# Patient Record
Sex: Male | Born: 1960 | Race: White | Hispanic: No | Marital: Married | Smoking: Never smoker
Health system: Southern US, Community
[De-identification: ages and names within clinical notes are randomized; demographics above are authoritative.]

## PROBLEM LIST (undated history)

## (undated) DIAGNOSIS — F32A Depression, unspecified: Secondary | ICD-10-CM

## (undated) DIAGNOSIS — N200 Calculus of kidney: Secondary | ICD-10-CM

## (undated) DIAGNOSIS — G0490 Encephalitis and encephalomyelitis, unspecified: Secondary | ICD-10-CM

## (undated) DIAGNOSIS — G0491 Myelitis, unspecified: Secondary | ICD-10-CM

## (undated) DIAGNOSIS — F419 Anxiety disorder, unspecified: Secondary | ICD-10-CM

## (undated) DIAGNOSIS — G35 Multiple sclerosis: Secondary | ICD-10-CM

## (undated) DIAGNOSIS — F329 Major depressive disorder, single episode, unspecified: Secondary | ICD-10-CM

## (undated) HISTORY — DX: Encephalitis and encephalomyelitis, unspecified: G04.90

## (undated) HISTORY — DX: Major depressive disorder, single episode, unspecified: F32.9

## (undated) HISTORY — DX: Depression, unspecified: F32.A

## (undated) HISTORY — DX: Calculus of kidney: N20.0

## (undated) HISTORY — DX: Multiple sclerosis: G35

## (undated) HISTORY — DX: Myelitis, unspecified: G04.91

## (undated) HISTORY — DX: Anxiety disorder, unspecified: F41.9

---

## 2012-01-29 ENCOUNTER — Other Ambulatory Visit: Payer: Self-pay | Admitting: Neurology

## 2012-01-29 DIAGNOSIS — G373 Acute transverse myelitis in demyelinating disease of central nervous system: Secondary | ICD-10-CM

## 2012-02-09 ENCOUNTER — Ambulatory Visit
Admission: RE | Admit: 2012-02-09 | Discharge: 2012-02-09 | Disposition: A | Discharge status: Home / Self Care | Payer: PRIVATE HEALTH INSURANCE | Source: Ambulatory Visit | Attending: Neurology | Admitting: Neurology

## 2012-02-09 VITALS — BP 140/81 | HR 54

## 2012-02-09 DIAGNOSIS — G373 Acute transverse myelitis in demyelinating disease of central nervous system: Secondary | ICD-10-CM

## 2012-02-09 LAB — CSF CELL COUNT WITH DIFFERENTIAL: RBC Count, CSF: 0 cu mm

## 2012-02-09 LAB — GLUCOSE, CSF

## 2012-02-09 LAB — PROTEIN, CSF

## 2012-02-09 NOTE — Progress Notes (Signed)
One tiger-topped tube blood drawn from right [facility] space without difficulty for labs with procedure; site unremarkable.  Drawn by Victory Dakin, RN

## 2012-02-12 LAB — CNS IGG SYNTHESIS RATE, CSF+BLOOD
Albumin, CSF: 20.7 mg/dL (ref 8.0–42.0)
IgG Index, CSF: 0.99 — ABNORMAL HIGH (ref <0.66)
IgG, CSF: 3.9 mg/dL (ref 0.8–7.7)
IgG, Serum: 856 mg/dL (ref 694–1618)
MS CNS IgG Synthesis Rate: 7.4 mg/24 h — ABNORMAL HIGH (ref <=3.3)

## 2012-02-16 LAB — MULTIPLE SCLEROSIS PANEL 2
Albumin Index: 4.7 (ref <9.0)
IgG Total CSF: 3.9 mg/dL (ref 0.5–6.1)
IgG Total: 819 mg/dL (ref 694–1618)
IgG-Index: 1 — ABNORMAL HIGH (ref <0.70)

## 2012-07-12 ENCOUNTER — Encounter: Payer: Self-pay | Admitting: Neurology

## 2012-07-12 ENCOUNTER — Ambulatory Visit (INDEPENDENT_AMBULATORY_CARE_PROVIDER_SITE_OTHER): Payer: No Typology Code available for payment source | Admitting: Neurology

## 2012-07-12 VITALS — BP 135/80 | Ht 71.0 in | Wt 215.0 lb

## 2012-07-12 DIAGNOSIS — F419 Anxiety disorder, unspecified: Secondary | ICD-10-CM

## 2012-07-12 DIAGNOSIS — F32A Depression, unspecified: Secondary | ICD-10-CM | POA: Insufficient documentation

## 2012-07-12 DIAGNOSIS — G35 Multiple sclerosis: Secondary | ICD-10-CM | POA: Insufficient documentation

## 2012-07-12 DIAGNOSIS — F411 Generalized anxiety disorder: Secondary | ICD-10-CM

## 2012-07-12 DIAGNOSIS — F329 Major depressive disorder, single episode, unspecified: Secondary | ICD-10-CM

## 2012-07-12 MED ORDER — GABAPENTIN 300 MG PO CAPS: 600.0000 mg | ORAL_CAPSULE | Freq: Three times a day (TID) | ORAL | Status: DC

## 2012-07-12 NOTE — Progress Notes (Signed)
HPI: Timothy Love is a 52 years old right-handed Caucasian male, referred by his primary care physician Dr. Emeline Gins, an orthopedic surgeon Dr. 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 Clent Ridges, MRI of thoracic spine has demonstrate right upper thoracic spine, and the left lower thoracic spine T2 lesions, expending one to 2 segment, MRI of the brain with and without contrast in [facility] demonstrated right frontal T2 lesion, left coronal radiata lesions,  there was one right parieto-occipital lobe with thin linear enhancing lesions.  He was not offered any treatment, overall he has much improved, now ambulates with mild unsteadiness, still has numbness at mid chest level, no incontinence, he also has occasional shooting numbness to his bilateral upper extremity, Lhermitte signs when he bends down his neck  I have reviewed the MRI film from [facility] diagnostic imaging, MRI cervical had patchy area of abnormality, at C2, 3, 4, 5, 6, T1-2.  CSF, more than 5 oligoclonal banding, WBC 9, above findings support definite diagnosis of relapsing remitting multiple sclerosis, he continued to have Lhermitt signs, ambulate without difficulty, no visual loss,   visual evoked potential was abnormal, secondary to a prolonged P100 latency on the left. 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    UPDATE April 11 th 2014,   [name] was started in Jan 15th 2014, his insurance will not cover [name], overall he is doing well, he took the medicine Monday Wednesday Friday evening, proceeding with ibuprofen, he complains of mild worsening depression, he also has bilateral lower extremity increases the tightness 3/10 regularly, can go up to 8/10, 10% of his time, achy pain, got up and then 300 mg 3 times a day does help him, he still works full-time as Personnel officer, performing 95% off work capacity, avoiding high ladder climbing, he does not want to consider research trial,  Physical Exam  Neck: supple no carotid bruits Respiratory: clear to auscultation bilaterally Cardiovascular: regular rate rhythm  Neurologic Exam  Mental Status: pleasant, awake, alert, cooperative to history, talking, and casual conversation. Cranial Nerves: CN II-XII pupils were equal round reactive to light.  Fundi were sharp bilaterally.  Extraocular movements were full.  Visual fields were full on confrontational test.  Facial sensation and strength were normal.  Hearing was intact to finger rubbing bilaterally.  Uvula tongue were midline.  Head turning and shoulder shrugging were normal and symmetric.  Tongue protrusion into the cheeks strength were normal.  Motor: mild bilateral lower extremity spasticity. no significant weakness. Sensory: decreased light touch, pinprick to mid thoracic level at lower extremity, preserved toe proprioception, and vibratory sensation. Coordination: Normal finger-to-nose, heel-to-shin.  There was no dysmetria noticed. Gait and Station: Narrow based and steady, was able to perform tiptoe, heel, and tandem walking with out significant  difficulty.  Romberg sign: Negative Reflexes: Deep tendon reflexes: Biceps: 2/2, Brachioradialis: 2/2, Triceps: 2/2, Pateller: 2/2, Achilles: 2/2.  Plantar responses are  extensor bilaterally.   Assessment and plan  52 years old right-handed Caucasian male,Definite relapsing remitting  multiple sclerosis,based on abnormal MRI of brain, cervical, and thoracic spine,positive oligoclonal banding, abnormal visual evoked potential.  1. Continue [name]. 2. Paxil for depression 3. Gabapentin 300mg  ii tid.  4. Labs 5. RTC In 4-6 months

## 2012-07-13 LAB — CBC
MCH: 30.2 pg (ref 26.6–33.0)
MCV: 87 fL (ref 79–97)
Platelets: 163 10*3/uL (ref 155–379)
RDW: 13.3 % (ref 12.3–15.4)

## 2012-07-13 LAB — COMPREHENSIVE METABOLIC PANEL
ALT: 26 IU/L (ref 0–44)
AST: 22 IU/L (ref 0–40)
CO2: 26 mmol/L (ref 19–28)
Calcium: 9.4 mg/dL (ref 8.7–10.2)
Potassium: 4.1 mmol/L (ref 3.5–5.2)
Sodium: 143 mmol/L (ref 134–144)

## 2012-07-17 NOTE — Progress Notes (Signed)
Quick Note:  Please call him for essentially normal lab, advise him to keep well hydration ______

## 2012-08-30 ENCOUNTER — Telehealth: Payer: Self-pay | Admitting: *Deleted

## 2012-08-30 NOTE — Telephone Encounter (Signed)
He is trying to get assistance in getting a cooling vest.  He spends a lot of time out doors and this would benefit him.  He needs confirmation that he has been diagnosed with MS in order to get the assistance.   Please fax it to [facility] with [employer] [facility] at   [phone].  If you have any questions call patient at  [phone]

## 2012-10-02 ENCOUNTER — Telehealth: Payer: Self-pay | Admitting: Neurology

## 2012-10-02 DIAGNOSIS — G35 Multiple sclerosis: Secondary | ICD-10-CM

## 2012-10-02 NOTE — Telephone Encounter (Signed)
Patient wife wants a referral to [facility] neurology for a second opinion. Please advise.

## 2012-10-03 NOTE — Telephone Encounter (Signed)
Please let him know, refer was put in

## 2012-12-13 ENCOUNTER — Ambulatory Visit: Payer: No Typology Code available for payment source | Admitting: Neurology

## 2013-03-28 ENCOUNTER — Telehealth: Payer: Self-pay | Admitting: Neurology

## 2013-03-31 NOTE — Telephone Encounter (Signed)
Patient calling to follow up on this, patient says he has been waiting for awhile now and still has not heard anything back.

## 2013-05-30 ENCOUNTER — Encounter: Payer: Self-pay | Admitting: Neurology

## 2013-05-30 ENCOUNTER — Ambulatory Visit (INDEPENDENT_AMBULATORY_CARE_PROVIDER_SITE_OTHER): Payer: BC Managed Care – PPO | Admitting: Neurology

## 2013-05-30 ENCOUNTER — Encounter (INDEPENDENT_AMBULATORY_CARE_PROVIDER_SITE_OTHER): Payer: Self-pay

## 2013-05-30 VITALS — BP 151/91 | HR 60 | Ht 71.0 in | Wt 220.0 lb

## 2013-05-30 DIAGNOSIS — R252 Cramp and spasm: Secondary | ICD-10-CM

## 2013-05-30 DIAGNOSIS — R259 Unspecified abnormal involuntary movements: Secondary | ICD-10-CM

## 2013-05-30 MED ORDER — MODAFINIL 100 MG PO TABS: 100.0000 mg | ORAL_TABLET | Freq: Two times a day (BID) | ORAL | Status: DC

## 2013-05-30 NOTE — Progress Notes (Signed)
HPI: Timothy Love is a 53 years old right-handed Caucasian male, referred by his primary care physician Dr. Emeline GinsSidney Harris, an orthopedic surgeon Dr. Clent RidgesWalsh for evaluation of transverse myelitis, last visit was April 2014.   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 Clent RidgesWalsh, MRI of thoracic spine has demonstrate right upper thoracic spine, and the left lower thoracic spine T2 lesions, expending one to 2 segment  [facility] with and [facility] demonstrated right frontal T2 lesion, left coronal radiata lesions,  there was one right parieto-occipital lobe with thin linear enhancing lesions.  He was not offered any treatment, overall he has much improved, now ambulates with mild unsteadiness, still has numbness at mid chest level, no incontinence, he also has occasional shooting numbness to his bilateral upper extremity, Lhermitte signs when he bends down his neck  I have reviewed the [facility] film [facility], MRI cervical had patchy area of abnormality, at C2, 3, 4, 5, 6, T1-2.  CSF, more than 5 oligoclonal banding, WBC 9, above findings support definite diagnosis of relapsing remitting multiple sclerosis, he continued to have Lhermitt signs, ambulate without difficulty, no visual loss,   visual evoked potential was abnormal, secondary to a prolonged P100 latency on the left. 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% off work capacity, avoiding high ladder climbing, he does not want to consider research trial,  UPDATE Feb 27th 2015: He was changed to [place] since Nov 2014, mild nause, flushing, after he had a repeat MRI brain and spine in Oct 2014, he had a [facility], by Elvera BickerZelasky, [name]  Over the past one year, overall, he is clinically stable, but on the repeat MRI of the brain, cervical, and thoracic spine, there was progression of disease, this was done in October 2014 at [facility]   MRI thoracic spine, Atrophic appearing thoracic cord with multiple T2 hyperintense lesions consistent with demyelinating plaques. No lesions demonstrate enhancement to indicate active demyelination.Compression deformity of the T10 vertebral body with edema and enhancement in the superior endplate, indicating that this is acute/subacute in nature.  most notably at the T7-8 level appear larger than on the examination from 08/09/11. In addition, the T10 compression deformity described on the current study is new, and was not present on the 08/09/11 study  MRI cervical spine, the brainstem lesions described on the present examination from 01/03/13  appear new compared to the 02/19/12 study. Multiple lesions throughout the cervical cord appear overall unchanged. multiple T2 hyperintense plaques throughout the cervical cord and proximal brainstem most notably the craniocervical junction and throughout the cervical cord extending from  approximately C2-C6. No enhancement is seen. There is mild multilevel uncovertebral and facet hypertrophy, but without significant canal or foraminal stenosis.   MRI of brain, Multiple T2 hyperintense lesions throughout the brainstem and cervical cord consistent with multiple sclerosis. No enhancement to suggest active demyelination.There is definitely a new lesion in the left periventricular white matter. There are also likely several new punctate lesions adjacent to the anterior horn of the cheeks and right lateral ventricle, however this may be exaggerated given differences in slice thickness between the 2 studies. The infratentorial lesions are not definitively seen on the prior study, however this may again be due to to technical factors.  He still works full times, at times, he complains of fatigue, legs gave up, hands does not cooperative, finger tips and feet paresthesia, gets tired, falling off ladders, loose balance,  No significant bowel and bladder issues, urgerncy.    Physical Exam  Neck: supple no carotid bruits Respiratory: clear to auscultation bilaterally Cardiovascular: regular rate rhythm  Neurologic Exam  Mental Status: pleasant, awake, alert, cooperative to history, talking, and casual conversation. Cranial Nerves: CN II-XII pupils were equal round reactive to light.  Fundi were sharp bilaterally.  Extraocular movements were full.  Visual fields were full on confrontational test.  Facial sensation and strength were normal.  Hearing was intact to finger rubbing bilaterally.  Uvula tongue were midline.  Head turning and shoulder shrugging were normal and symmetric.  Tongue protrusion into the cheeks strength were normal.  Motor: mild bilateral lower extremity spasticity. no significant weakness. Sensory: decreased light touch, pinprick to mid thoracic level at lower extremity, preserved toe proprioception, and vibratory sensation. Coordination: Normal finger-to-nose,  heel-to-shin.  There was no dysmetria noticed. Gait and Station: Narrow based and steady, was able to perform tiptoe, heel, and tandem walking with out significant  difficulty.  Romberg sign: Negative Reflexes: Deep tendon reflexes: Biceps: 2/2, Brachioradialis: 2/2, Triceps: 2/2, Pateller: 2/2, Achilles: 2/2.  Plantar responses are extensor bilaterally.   Assessment and plan  53 years old right-handed Caucasian male,Definite relapsing remitting multiple sclerosis,based on abnormal MRI of brain, cervical, and thoracic spine,positive oligoclonal banding, abnormal visual evoked potential.  1. Continue [name] 2. Start Provigil 100mg  bid. 3, he is willing to enroll in [employer] Spasticity trial for Baclofen ER.

## 2013-06-06 ENCOUNTER — Telehealth: Payer: Self-pay | Admitting: Neurology

## 2013-06-06 NOTE — Telephone Encounter (Signed)
Pt called.  He wanted to update the dosage amount for his Vitamin D; he is taking 2000 IU.  He also stated that when the prescription for the Provigil was sent to the pharmacy his insurance wouldn't pay for it.  His pharmacist stated they would be faxing [place] something in order for his insurance to cover it.  Please call if necessary.  Thank you.

## 2013-06-06 NOTE — Telephone Encounter (Signed)
Pt called and stated that [facility] will be sending a CD of the last MRI he had there to Dr. Zannie Cove attention.  He wanted to make her aware of this so that we could be looking for it.  Please contact if necessary.  Thank you.

## 2013-06-06 NOTE — Telephone Encounter (Signed)
Updated Vitamin D OTC dose on med list per patient request.  As well, I have faxed all requested info to insurance asking that they grant an exception and approve coverage for Provigil.  Pending response.

## 2013-06-10 ENCOUNTER — Telehealth: Payer: Self-pay | Admitting: Neurology

## 2013-06-10 NOTE — Telephone Encounter (Signed)
Shauna from [employer] calling to state that patient's insurance has denied prior authorization for Provigil and the reason is because he does not have an illness that the drug has been shown to help. If questions, Blake Divine has advised [place] to call patient's insurance company. Blake Divine is also faxing [place] this information.

## 2013-06-13 ENCOUNTER — Telehealth: Payer: Self-pay

## 2013-06-13 MED ORDER — METHYLPHENIDATE HCL 5 MG PO TABS: 5.0000 mg | ORAL_TABLET | Freq: Two times a day (BID) | ORAL | Status: DC

## 2013-06-13 NOTE — Telephone Encounter (Signed)
We received a letter from the patients insurance indicating they have denied our request for coverage of Provigil.  They state this medication is not being prescribed for an indication specified in the plans policy, which includes: Narcolepsy (sleep study required), Obstructive Sleep Apnea (sleep study required, with use of CPAP) or Shift Work Sleep Disorder (documention required).  They will not cover Nuvigil or Modafinil either, unless the patient meets this same criteria.  No formulary alternatives were listed.  Would you like to prescribe a different medication?  Please advise.

## 2013-06-13 NOTE — Telephone Encounter (Signed)
I have called him, his insurance would not cover provigil, will try Ritalin 5mg  bid

## 2013-06-20 ENCOUNTER — Other Ambulatory Visit: Payer: Self-pay | Admitting: Neurology

## 2013-06-20 MED ORDER — METHYLPHENIDATE HCL 5 MG PO TABS: 5.0000 mg | ORAL_TABLET | Freq: Two times a day (BID) | ORAL | Status: DC

## 2013-06-20 NOTE — Telephone Encounter (Signed)
I called the patient back.  Explained this med is not sampled and he would need to come to the office to get a written Rx each time he needs a refill.  Patient verbalized understanding.  Says he's not in a rush to get Rx, as he has not been fatigued lately.  Advised him we will call him when the Rx is ready for pick up.

## 2013-06-20 NOTE — Telephone Encounter (Signed)
Called looking for RX or samples for Ritalin. Nothing up front. He also wants to know if this is something he would have to come pick up every month. Please call

## 2013-06-20 NOTE — Telephone Encounter (Signed)
Patient was notified that the rx is ready for pickup.  Patient verbalized understanding.

## 2013-07-17 ENCOUNTER — Other Ambulatory Visit: Payer: BC Managed Care – PPO

## 2013-08-01 ENCOUNTER — Other Ambulatory Visit: Payer: Self-pay | Admitting: Neurology

## 2013-08-06 ENCOUNTER — Encounter: Payer: Self-pay | Admitting: Neurology

## 2013-08-08 ENCOUNTER — Encounter: Payer: Self-pay | Admitting: Neurology

## 2013-08-08 ENCOUNTER — Ambulatory Visit (INDEPENDENT_AMBULATORY_CARE_PROVIDER_SITE_OTHER): Payer: BC Managed Care – PPO | Admitting: Neurology

## 2013-08-08 VITALS — BP 128/80 | HR 57 | Ht 71.0 in | Wt 212.0 lb

## 2013-08-08 DIAGNOSIS — F32A Depression, unspecified: Secondary | ICD-10-CM

## 2013-08-08 DIAGNOSIS — F419 Anxiety disorder, unspecified: Secondary | ICD-10-CM

## 2013-08-08 DIAGNOSIS — F411 Generalized anxiety disorder: Secondary | ICD-10-CM

## 2013-08-08 DIAGNOSIS — R252 Cramp and spasm: Secondary | ICD-10-CM

## 2013-08-08 DIAGNOSIS — G35 Multiple sclerosis: Secondary | ICD-10-CM

## 2013-08-08 DIAGNOSIS — F3289 Other specified depressive episodes: Secondary | ICD-10-CM

## 2013-08-08 DIAGNOSIS — R259 Unspecified abnormal involuntary movements: Secondary | ICD-10-CM

## 2013-08-08 DIAGNOSIS — F329 Major depressive disorder, single episode, unspecified: Secondary | ICD-10-CM

## 2013-08-08 MED ORDER — BACLOFEN 10 MG PO TABS: 10.0000 mg | ORAL_TABLET | Freq: Three times a day (TID) | ORAL | Status: DC

## 2013-08-08 MED ORDER — METHYLPHENIDATE HCL 5 MG PO TABS: 5.0000 mg | ORAL_TABLET | Freq: Two times a day (BID) | ORAL | Status: DC

## 2013-08-08 NOTE — Progress Notes (Signed)
HPI: Timothy Love is a 53 years old right-handed Caucasian male, referred by his primary care physician Dr. Emeline Gins, an orthopedic surgeon Dr. Clent Ridges for evaluation of transverse myelitis, last visit was April 2014.   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 Clent Ridges, MRI of thoracic spine has demonstrate right upper thoracic spine, and the left lower thoracic spine T2 lesions, expending one to 2 segment  MRI of the brain with and without contrast in [facility] demonstrated right frontal T2 lesion, left coronal radiata lesions,  there was one right parieto-occipital lobe with thin linear enhancing lesions.  He was not offered any treatment, overall he has much improved, now ambulates with mild unsteadiness, still has numbness at mid chest level, no incontinence, he also has occasional shooting numbness to his bilateral upper extremity, Lhermitte signs when he bends down his neck  I have reviewed the MRI film from [facility] diagnostic imaging, MRI cervical had patchy area of abnormality, at C2, 3, 4, 5, 6, T1-2.  CSF, more than 5 oligoclonal banding, WBC 9, above findings support definite diagnosis of relapsing remitting multiple sclerosis, he continued to have Lhermitt signs, ambulate without difficulty, no visual loss,   visual evoked potential was abnormal, secondary to a prolonged P100 latency on the left. 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 capacity, avoiding high ladder climbing, he does not want to consider research trial,  UPDATE Feb 27th 2015: He was changed to [name] since Nov 2014, mild nause, flushing, after he had a repeat MRI brain and spine in Oct 2014, he had a second opinion at [facility], by [name], [name]  Over the past one year, overall, he is clinically stable, but on the repeat MRI of the brain, cervical, and thoracic spine, there was progression of disease, this was done in October 2014 at [facility]   MRI thoracic spine, Atrophic appearing thoracic cord with multiple T2 hyperintense lesions consistent with demyelinating plaques. No lesions demonstrate enhancement to indicate active demyelination.Compression deformity of the T10 vertebral body with edema and enhancement in the superior endplate, indicating that this is acute/subacute in nature.  most notably at the T7-8 level appear larger than on the examination from 08/09/11. In addition, the T10 compression deformity described on the current study is new, and was not present on the 08/09/11 study  MRI cervical spine, the brainstem lesions described on the present examination from 01/03/13  appear new compared to the 02/19/12 study. Multiple lesions throughout the cervical cord appear overall unchanged. multiple T2 hyperintense plaques throughout the cervical cord and proximal brainstem most notably the craniocervical junction and throughout the cervical cord extending from  approximately C2-C6. No enhancement is seen. There is mild multilevel uncovertebral and facet hypertrophy, but without significant canal or foraminal stenosis.   MRI of brain, Multiple T2 hyperintense lesions throughout the brainstem and cervical cord consistent with multiple sclerosis. No enhancement to suggest active demyelination.There is definitely a new lesion in the left periventricular white matter. There are also likely several new punctate lesions adjacent to the anterior horn of the cheeks and right lateral ventricle, however this may be exaggerated given differences in slice thickness between the 2 studies. The infratentorial lesions are not definitively seen on the prior study, however this may again be due to to technical factors.  He still works full times, at times, he complains of fatigue, legs gave up, hands does not cooperative, finger tips and feet paresthesia, gets tired, falling off ladders, loose balance,  No significant bowel and bladder issues, urgerncy.  UPDATE May 8th 2015:  [place] [place] we have reviewed MRI thoracic spine, persistent signal alteration within the cord, in a similar distribution as the prior examination. The overall size of the cord however is not as prominent suggesting possible mild improvement. No new signal alteration is evident, and there is no  abnormal region of enhancement on the postcontrast portion of the examination.  Minimal degenerative disc disease without significant spinal or  foraminal stenosis at any level.   MRI cervical spine: With and without contrast, persistent signal alteration within the cervical cord, in a distribution similar to previous examination, the overall size of the cortex is not as prominent, suggesting possible mild improvement, no new abnormal signals, no contrast enhancement.  MRI of the brain without contrast report, that he was stable white matter disease, in comparison to August 2013, the distribution of  consistent with multiple sclerosis, no contrast enhancement, 5 mm enhancing focus along the undersurface of right frontal lobe, suggesting a tiny meningioma, next lef   clinically patient is stable as well, his fatigue has much improved with ranitidine 5 mg twice a day, no significant gait difficulty, but occasionally bilateral lower extremity muscle spasm, jumpy movement, no visual change,   Physical Exam  Neck: supple no carotid bruits Respiratory: clear to auscultation bilaterally Cardiovascular: regular rate rhythm  Neurologic Exam  Mental Status: pleasant, awake, alert, cooperative to history, talking, and casual conversation. Cranial Nerves: CN II-XII pupils were equal round reactive to light.  Fundi were sharp bilaterally.  Extraocular movements were full.  Visual fields were full on confrontational test.  Facial sensation and strength were normal.  Hearing was intact to finger rubbing bilaterally.  Uvula tongue were midline.  Head turning and shoulder shrugging were normal and symmetric.  Tongue protrusion into the cheeks strength were normal.  Motor: mild bilateral lower extremity spasticity. no significant weakness. Sensory: decreased light touch, pinprick to mid thoracic level at lower extremity, preserved toe proprioception, and vibratory sensation. Coordination: Normal finger-to-nose, heel-to-shin.  There was no dysmetria noticed. Gait and Station: Narrow based and steady, was able to perform tiptoe, heel, and tandem walking with out significant  difficulty.  Romberg sign: Negative Reflexes: Deep tendon reflexes: Biceps: 2/2, Brachioradialis: 2/2, Triceps: 2/2, Pateller: 2/2, Achilles: 2/2.  Plantar responses are extensor bilaterally.   Assessment and plan  53 years old right-handed Caucasian  male, definite relapsing remitting multiple sclerosis,based on abnormal MRI of brain, cervical, and thoracic spine,positive oligoclonal banding, abnormal visual evoked potential.  1. Continue  [name] 2. refill Ritalin 5 mg twice a day, 90 day supply, 3. Start Baclofen IR 10mg  tid since may 8th 2015. 4, he is willing to enroll in [employer] Spasticity trial for Baclofen ER.

## 2013-08-12 ENCOUNTER — Telehealth: Payer: Self-pay | Admitting: Neurology

## 2013-08-12 NOTE — Telephone Encounter (Signed)
I have reviewed MRI report from [facility], dated October 2014, MRI of the brain showed multiple white matter lesions, scattered flair hyperintense lesions in the periventricular white matter, left pons, right superior cerebellum, right brachial pontes, subcortical lesions at bilateral anterior frontal lobes, no abnormal enhancement, optic nerves are atrophic,  MRI cervical spine showed multiple T. 2 hyperintense lesions throughout the brain stem, and cervical cord, extending from C2-C6, no enhancing lesions,  MRI of thoracic spine, atrophic appearing thoracic cord with multiple T2 hyperintense lesions, consistent with demyelinating plaques, no contrast enhancement, compression deformity of T10 vertebral bodies with edema, enhancement in the superior endplate,

## 2013-09-05 ENCOUNTER — Other Ambulatory Visit: Payer: Self-pay | Admitting: Neurology

## 2013-09-05 NOTE — Telephone Encounter (Signed)
Patient requesting a written Rx for Rittalin--please call patient when ready for pick up.

## 2013-09-05 NOTE — Telephone Encounter (Signed)
Request forwarded to provider for approval  

## 2013-09-08 MED ORDER — METHYLPHENIDATE HCL 5 MG PO TABS: 5.0000 mg | ORAL_TABLET | Freq: Two times a day (BID) | ORAL | Status: DC

## 2013-09-09 NOTE — Telephone Encounter (Signed)
Called pt and left message informing him that his Rx was ready to be picked up at the front desk and if he has any other problems, questions or concerns to call the office. °

## 2013-10-06 ENCOUNTER — Other Ambulatory Visit: Payer: Self-pay | Admitting: Neurology

## 2013-10-06 MED ORDER — METHYLPHENIDATE HCL 5 MG PO TABS: 5.0000 mg | ORAL_TABLET | Freq: Two times a day (BID) | ORAL | Status: DC

## 2013-10-06 NOTE — Telephone Encounter (Signed)
This Rx was written last month for a 90 day supply.  I called the pharmacy, spoke with Denyse Amassorey.  He said the patient's ins will only allow 30 days per fill, so they filled #60 and then the remaining tabs were void because it's a CII.  Rx has been entered for 30 day supply and forwarded to provider for approval.

## 2013-10-06 NOTE — Telephone Encounter (Signed)
Patient requesting Rx refill for methylphenidate (RITALIN) 5 MG tablet and requesting call when ready for pick up.

## 2013-10-06 NOTE — Telephone Encounter (Signed)
Called pt and left message informing him that his Rx was ready to be picked up at the front desk and if he has any other problems, questions or concerns to call the office. °

## 2013-11-04 ENCOUNTER — Telehealth: Payer: Self-pay | Admitting: Neurology

## 2013-11-04 NOTE — Telephone Encounter (Signed)
Patient requesting Rx refill for methylphenidate (RITALIN) 5 MG tablet,  Please call anytime when ready for pick up.  If not available please leave message on voice mail.  Thanks

## 2013-11-05 ENCOUNTER — Other Ambulatory Visit: Payer: Self-pay

## 2013-11-05 MED ORDER — METHYLPHENIDATE HCL 5 MG PO TABS: 5.0000 mg | ORAL_TABLET | Freq: Two times a day (BID) | ORAL | Status: DC

## 2013-11-06 NOTE — Telephone Encounter (Signed)
Called pt and left message informing him that his Rx was ready to be picked up at the front desk and if he has any other problems, questions or concerns to call the office. °

## 2013-12-10 ENCOUNTER — Other Ambulatory Visit: Payer: Self-pay | Admitting: Neurology

## 2013-12-10 MED ORDER — METHYLPHENIDATE HCL 5 MG PO TABS: 5.0000 mg | ORAL_TABLET | Freq: Two times a day (BID) | ORAL | Status: DC

## 2013-12-10 NOTE — Telephone Encounter (Signed)
Rx entered, request forwarded to provider for approval  

## 2013-12-10 NOTE — Telephone Encounter (Signed)
Patient requesting refill of Ritalin (generic). Best call back # is [phone]

## 2013-12-11 ENCOUNTER — Telehealth: Payer: Self-pay | Admitting: Neurology

## 2013-12-11 NOTE — Telephone Encounter (Signed)
Timothy Love just spoke with patient advising Rx is ready for pick up.  Please see previous encounter

## 2013-12-11 NOTE — Telephone Encounter (Signed)
Called pt to inform him that his Rx was ready to be picked up at the front desk and if he has any other problems, questions or concerns to call the office. Pt verbalized understanding. °

## 2013-12-11 NOTE — Telephone Encounter (Signed)
Patient calling to state that the only day he can come pick up his prescription is Friday and is hoping it will be ready. He was assured he will be called when it is ready for pick up.

## 2013-12-31 ENCOUNTER — Telehealth: Payer: Self-pay | Admitting: Neurology

## 2013-12-31 MED ORDER — DIMETHYL FUMARATE 240 MG PO CPDR: 240.0000 mg | DELAYED_RELEASE_CAPSULE | Freq: Two times a day (BID) | ORAL | Status: DC

## 2013-12-31 NOTE — Telephone Encounter (Signed)
Rx has been sent.  I called the patient back.  He is aware.  

## 2013-12-31 NOTE — Telephone Encounter (Signed)
Patient calling to state that his [facility] pharmacy called him to state that he needs to have his [name] script extended, please return call and advise.

## 2014-01-05 ENCOUNTER — Telehealth: Payer: Self-pay | Admitting: Neurology

## 2014-01-05 NOTE — Telephone Encounter (Signed)
30 day supply last written 09/09

## 2014-01-05 NOTE — Telephone Encounter (Signed)
Patient requesting Rx refill for methylphenidate (RITALIN) 5 MG tablet.  Please call when ready for pick up. ° °

## 2014-01-06 ENCOUNTER — Other Ambulatory Visit: Payer: Self-pay

## 2014-01-09 MED ORDER — METHYLPHENIDATE HCL 5 MG PO TABS: 5.0000 mg | ORAL_TABLET | Freq: Two times a day (BID) | ORAL | Status: DC

## 2014-01-09 NOTE — Telephone Encounter (Signed)
Request was forwarded to provider for approval on 10/06.  It is currently pending.  I called the patient back, got no answer.  Left message.

## 2014-01-09 NOTE — Telephone Encounter (Signed)
Patient calling again to check on the status of his refill, please return call and advise, ok to leave a message since he says he's about to go and have surgery.

## 2014-01-12 ENCOUNTER — Telehealth: Payer: Self-pay | Admitting: *Deleted

## 2014-01-12 NOTE — Telephone Encounter (Signed)
Called pt to inform him that his Rx was ready to be picked up at the front desk and if he has any other problems, questions or concerns to call the office. Pt verbalized understanding. °

## 2014-01-12 NOTE — Telephone Encounter (Signed)
Patient calling to check the status of his Rx Adderall, please advise

## 2014-02-03 ENCOUNTER — Other Ambulatory Visit: Payer: Self-pay | Admitting: Neurology

## 2014-02-03 MED ORDER — METHYLPHENIDATE HCL 5 MG PO TABS: 5.0000 mg | ORAL_TABLET | Freq: Two times a day (BID) | ORAL | Status: DC

## 2014-02-03 NOTE — Telephone Encounter (Signed)
Request entered, forwarded to provider for approval.  

## 2014-02-03 NOTE — Telephone Encounter (Signed)
Patient requesting refill of Ritalin script, please call when ready for pick up.  °

## 2014-02-04 NOTE — Telephone Encounter (Signed)
I called the patient to let them know their Rx for Ritalin was ready for pickup. Patient was instructed to bring Photo ID. 

## 2014-02-06 ENCOUNTER — Telehealth: Payer: Self-pay | Admitting: Neurology

## 2014-02-06 NOTE — Telephone Encounter (Signed)
I called the patient back.  Got no answer.  Left message asking if he had spoken with PCP regarding Rx, and if so, have they have agreed to prescribe this med?

## 2014-02-06 NOTE — Telephone Encounter (Signed)
Patient would like to have his PCP in [place] (Dr. Emeline GinsSidney Harris) write his Rx for Ritalin (generic) so he would not have to drive down here once a month to get it. If this is possible, please let him know. Best number to call is [phone].

## 2014-02-06 NOTE — Telephone Encounter (Signed)
Patient returned call and stated he would check with PCP to see if they would prescribe Rx.

## 2014-02-13 ENCOUNTER — Telehealth: Payer: Self-pay | Admitting: *Deleted

## 2014-02-13 NOTE — Telephone Encounter (Signed)
I called last week about getting my RX tranferred to [place]. Its the Ritalin. My PCP is

## 2014-03-06 ENCOUNTER — Other Ambulatory Visit: Payer: Self-pay | Admitting: Neurology

## 2014-03-06 ENCOUNTER — Ambulatory Visit (INDEPENDENT_AMBULATORY_CARE_PROVIDER_SITE_OTHER): Payer: BC Managed Care – PPO | Admitting: Neurology

## 2014-03-06 ENCOUNTER — Encounter: Payer: Self-pay | Admitting: Neurology

## 2014-03-06 VITALS — BP 139/81 | HR 52 | Ht 71.0 in | Wt 212.0 lb

## 2014-03-06 DIAGNOSIS — F329 Major depressive disorder, single episode, unspecified: Secondary | ICD-10-CM

## 2014-03-06 DIAGNOSIS — R258 Other abnormal involuntary movements: Secondary | ICD-10-CM

## 2014-03-06 DIAGNOSIS — F419 Anxiety disorder, unspecified: Secondary | ICD-10-CM

## 2014-03-06 DIAGNOSIS — R252 Cramp and spasm: Secondary | ICD-10-CM

## 2014-03-06 DIAGNOSIS — G35 Multiple sclerosis: Secondary | ICD-10-CM

## 2014-03-06 DIAGNOSIS — F32A Depression, unspecified: Secondary | ICD-10-CM

## 2014-03-06 MED ORDER — GABAPENTIN 300 MG PO CAPS: 600.0000 mg | ORAL_CAPSULE | Freq: Three times a day (TID) | ORAL | Status: DC

## 2014-03-06 MED ORDER — BACLOFEN 10 MG PO TABS: 10.0000 mg | ORAL_TABLET | Freq: Three times a day (TID) | ORAL | Status: DC

## 2014-03-06 MED ORDER — METHYLPHENIDATE HCL 5 MG PO TABS: 5.0000 mg | ORAL_TABLET | Freq: Two times a day (BID) | ORAL | Status: DC

## 2014-03-06 NOTE — Telephone Encounter (Signed)
Renee/Dana:  Please check on his MRI orders, it was ordered before, now it is due, do I need to reenter order again, or previous order is still good.

## 2014-03-06 NOTE — Progress Notes (Addendum)
HPI: Timothy Love is a 53 years old right-handed Caucasian male, referred by his primary care physician Dr. Emeline Gins, an orthopedic surgeon Dr. Clent Ridges for evaluation of transverse myelitis, last visit was April 2014.   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 Clent Ridges, MRI of thoracic spine has demonstrate right upper thoracic spine, and the left lower thoracic spine T2 lesions, expending one to 2 segment  MRI of the brain with and without contrast in [facility] demonstrated right frontal T2 lesion, left coronal radiata lesions,  there was one right parieto-occipital lobe with thin linear enhancing lesions.  He was not offered any treatment, overall he has much improved, now ambulates with mild unsteadiness, still has numbness at mid chest level, no incontinence, he also has occasional shooting numbness to his bilateral upper extremity, Lhermitte signs when he bends down his neck  I have reviewed the MRI film from [facility], MRI cervical had patchy area of abnormality, at C2, 3, 4, 5, 6, T1-2.  CSF, more than 5 oligoclonal banding, WBC 9, above findings support definite diagnosis of relapsing remitting multiple sclerosis, he continued to have Lhermitt signs, ambulate without difficulty, no visual loss,   visual evoked potential was abnormal, secondary to a prolonged P100 latency on the left. 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 capacity, avoiding high ladder climbing, he does not want to consider research trial,  UPDATE Feb 27th 2015: He was changed to [name] since Nov 2014, mild nause, flushing, after he had a repeat MRI brain and spine in Oct 2014, he had a second opinion at [facility], by [name], [name]  Over the past one year, overall, he is clinically stable, but on the repeat MRI of the brain, cervical, and thoracic spine, there was progression of disease, this was done in October 2014 at [facility]   MRI thoracic spine, Atrophic appearing thoracic cord with multiple T2 hyperintense lesions consistent with demyelinating plaques. No lesions demonstrate enhancement to indicate active demyelination.Compression deformity of the T10 vertebral body with edema and enhancement in the superior endplate, indicating that this is acute/subacute in nature.  most notably at the T7-8 level appear larger than on the examination from 08/09/11. In addition, the T10 compression deformity described on the current study is new, and was not present on the 08/09/11 study  MRI cervical spine, the brainstem lesions described on the present examination from 01/03/13  appear new compared to the 02/19/12 study. Multiple lesions throughout the cervical cord appear overall unchanged. multiple T2 hyperintense plaques throughout the cervical cord and proximal brainstem most notably the craniocervical junction and throughout the cervical cord extending from  approximately C2-C6. No enhancement is seen. There is mild multilevel uncovertebral and facet hypertrophy, but without significant canal or foraminal stenosis.   MRI of brain, Multiple T2 hyperintense lesions throughout the brainstem and cervical cord consistent with multiple sclerosis. No enhancement to suggest active demyelination.There is definitely a new lesion in the left periventricular white matter. There are also likely several new punctate lesions adjacent to the anterior horn of the cheeks and right lateral ventricle, however this may be exaggerated given differences in slice thickness between the 2 studies. The infratentorial lesions are not definitively seen on the prior study, however this may again be due to to technical factors.  He still works full times, at times, he complains of fatigue, legs gave up, hands does not cooperative, finger tips and feet paresthesia, gets tired, falling off ladders, loose balance,  No significant bowel and bladder issues, urgerncy.  UPDATE May 8th 2015:  He had MRI at [place], [place], we have reviewed MRI thoracic spine, persistent signal alteration within the cord, in a similar distribution as the prior examination. The overall size of the cord however is not as prominent suggesting possible mild improvement. No new signal alteration is evident, and there is no  abnormal region of enhancement on the postcontrast portion of the examination.  Minimal degenerative disc disease without significant spinal or  foraminal stenosis at any level.   MRI cervical spine: With and without contrast, persistent signal alteration within the cervical cord, in a distribution similar to previous examination, the overall size of the cortex is not as prominent, suggesting possible mild improvement, no new abnormal signals, no contrast enhancement.  MRI of the brain without contrast report, that he was stable white matter disease, in comparison to August 2013, the distribution of  consistent with multiple sclerosis, no contrast enhancement, 5 mm enhancing focus along the undersurface of right frontal lobe, suggesting a tiny meningioma, next lef   clinically patient is stable as well, his fatigue has much improved with ranitidine 5 mg twice a day, no significant gait difficulty, but occasionally bilateral lower extremity muscle spasm, jumpy movement, no visual change  UPDATE Dec 4th 2015: He is dong well, a lot better than 2014, he does not have those frequent episode of left jerking, ritalin 5 mg twice a day he has been very helpful, he was able to work full time  He has mild flushing with [name] treatment.  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 Cranial Nerves: CN II-XII pupils were equal round reactive to light.  Fundi were sharp bilaterally.  Extraocular movements were full.  Visual fields were full on confrontational test.  Facial sensation and strength were normal.  Hearing was intact to finger rubbing bilaterally.  Uvula tongue were midline.  Head turning and shoulder shrugging were normal and symmetric.  Tongue protrusion into the cheeks strength were normal.  Motor: mild bilateral lower extremity spasticity. no significant weakness. Sensory: decreased light touch, pinprick to mid thoracic level at lower extremity, preserved toe proprioception, and vibratory sensation. Coordination: Normal finger-to-nose, heel-to-shin.  There  was no dysmetria noticed. Gait and Station: Narrow based and steady, was able to perform tiptoe, heel, and tandem walking with out significant  difficulty.  Romberg sign: Negative Reflexes: Deep tendon reflexes: Biceps: 2/2, Brachioradialis: 2/2, Triceps: 2/2, Pateller: 2/2,  Achilles: 2/2.  Plantar responses are extensor bilaterally.  Assessment and plan  53 years old right-handed Caucasian male, definite relapsing remitting multiple sclerosis,based on abnormal MRI of brain, cervical, and thoracic spine,positive oligoclonal banding, abnormal visual evoked potential.  1. Continue [name] 2. refill Ritalin 5 mg twice a day, 90 day supply, 3. Refill Baclofen IR 10mg  tid  4,  Repeat MRI brain, cervical, thoracic w/wo. 5. RTC in 6 months.

## 2014-03-07 LAB — COMPREHENSIVE METABOLIC PANEL
ALT: 18 IU/L (ref 0–44)
AST: 17 IU/L (ref 0–40)
Albumin/Globulin Ratio: 2.1 (ref 1.1–2.5)
Albumin: 4.9 g/dL (ref 3.5–5.5)
Alkaline Phosphatase: 88 IU/L (ref 39–117)
BUN/Creatinine Ratio: 20 (ref 9–20)
BUN: 17 mg/dL (ref 6–24)
CALCIUM: 10.8 mg/dL — AB (ref 8.7–10.2)
CO2: 23 mmol/L (ref 18–29)
CREATININE: 0.83 mg/dL (ref 0.76–1.27)
Chloride: 103 mmol/L (ref 97–108)
GFR calc Af Amer: 116 mL/min/{1.73_m2} (ref >59)
GFR calc non Af Amer: 100 mL/min/{1.73_m2} (ref >59)
GLOBULIN, TOTAL: 2.3 g/dL (ref 1.5–4.5)
GLUCOSE: 89 mg/dL (ref 65–99)
Potassium: 5 mmol/L (ref 3.5–5.2)
SODIUM: 145 mmol/L — AB (ref 134–144)
Total Bilirubin: 0.5 mg/dL (ref 0.0–1.2)
Total Protein: 7.2 g/dL (ref 6.0–8.5)

## 2014-03-07 LAB — CBC WITH DIFFERENTIAL
Basophils Absolute: 0 10*3/uL (ref 0.0–0.2)
Basos: 1 %
Eos: 2 %
Eosinophils Absolute: 0.1 10*3/uL (ref 0.0–0.4)
HCT: 43.4 % (ref 37.5–51.0)
Hemoglobin: 14.7 g/dL (ref 12.6–17.7)
Immature Grans (Abs): 0 10*3/uL (ref 0.0–0.1)
Immature Granulocytes: 0 %
Lymphocytes Absolute: 0.5 10*3/uL — ABNORMAL LOW (ref 0.7–3.1)
Lymphs: 10 %
MCH: 29.6 pg (ref 26.6–33.0)
MCHC: 33.9 g/dL (ref 31.5–35.7)
MCV: 87 fL (ref 79–97)
Monocytes Absolute: 0.6 10*3/uL (ref 0.1–0.9)
Monocytes: 12 %
NEUTROS PCT: 75 %
Neutrophils Absolute: 3.6 10*3/uL (ref 1.4–7.0)
Platelets: 266 10*3/uL (ref 150–379)
RBC: 4.97 x10E6/uL (ref 4.14–5.80)
RDW: 13.3 % (ref 12.3–15.4)
WBC: 4.8 10*3/uL (ref 3.4–10.8)

## 2014-03-10 NOTE — Progress Notes (Signed)
Quick Note:  Called and spoke to patient relayed normal labs. ______ 

## 2014-03-12 ENCOUNTER — Telehealth: Payer: Self-pay | Admitting: Neurology

## 2014-03-12 MED ORDER — GABAPENTIN 300 MG PO CAPS: 600.0000 mg | ORAL_CAPSULE | Freq: Three times a day (TID) | ORAL | Status: DC

## 2014-03-12 NOTE — Telephone Encounter (Signed)
Patient hadn't received mail order supply of Rx gabapentin (NEURONTIN) 300 MG capsule.  Stated he has only enough for today, questioning if we could forward 10 day to [employer] in [place].  Please call and advise.

## 2014-03-12 NOTE — Telephone Encounter (Signed)
Rx has been sent.  I called back.  Patient is aware.  

## 2014-05-26 ENCOUNTER — Telehealth: Payer: Self-pay | Admitting: Neurology

## 2014-05-26 NOTE — Telephone Encounter (Signed)
I spoke to the patient in re the [facility] [id number] study, and the patient asked for the Informed Consent Form to review the study's information.

## 2014-06-12 ENCOUNTER — Telehealth: Payer: Self-pay | Admitting: Neurology

## 2014-06-12 NOTE — Telephone Encounter (Signed)
I have attempted without success to contact this patient by phone.I left a message for the patient to return my call.

## 2014-06-16 ENCOUNTER — Telehealth: Payer: Self-pay | Admitting: Neurology

## 2014-06-16 NOTE — Telephone Encounter (Signed)
I left a message for the patient to return my call.

## 2014-06-22 ENCOUNTER — Telehealth: Payer: Self-pay | Admitting: Neurology

## 2014-06-22 ENCOUNTER — Telehealth: Payer: Self-pay | Admitting: *Deleted

## 2014-06-22 NOTE — Telephone Encounter (Signed)
Left messages on both home and cell numbers letting him know that Dr. Terrace Arabia will need him to bring his MRI disc to his follow up appointment.

## 2014-06-22 NOTE — Telephone Encounter (Signed)
I left a message for the patient to return my call.

## 2014-06-22 NOTE — Telephone Encounter (Signed)
Results received from [facility] (scan completed on 06/19/14): Findings: There is no restricted diffusion to suggest an acute cortical or white matter infarct.  The scattered foci of high signal within the subcortical and periventricular white matter are stable.  There is no new focus of abnormal signal.  There is no significant atrophy, or ventriculomegaly .  The large vessel flow voids are grossly normal.  There is minimal bilateral ethmoid sinusitis.  The sinuses and mastoids are otherwise clear. Impression: Stable examination as described.

## 2014-06-25 ENCOUNTER — Other Ambulatory Visit: Payer: Self-pay | Admitting: Neurology

## 2014-06-26 ENCOUNTER — Telehealth: Payer: Self-pay | Admitting: Neurology

## 2014-06-26 NOTE — Telephone Encounter (Signed)
I have reviewed MRI report from [facility], MRI of cervical with without contrast June 26 2014, stable patchy abnormal high signal at the cervical cord, extending from C2-3, to approximately C6 and 4, no contrast enhancement  MRI of thoracic spine with and without contrast, probable patchy area of high signal at thoracic spine at T10, no contrast enhancement, similar appearance in the past.  Timothy Love:  He should also have MRI brain, please get MRI of the brain report,  Call patient, MRI of cervical, thoracic spine showed no significant change,  Also reminded patient to bring MRI CD at his next follow-up visit

## 2014-06-29 NOTE — Telephone Encounter (Signed)
Informed patient - left message on both home and cell to please bring in CD for MRI brain for his next follow up appt.

## 2014-09-10 ENCOUNTER — Ambulatory Visit (INDEPENDENT_AMBULATORY_CARE_PROVIDER_SITE_OTHER): Payer: BLUE CROSS/BLUE SHIELD | Admitting: Neurology

## 2014-09-10 ENCOUNTER — Encounter: Payer: Self-pay | Admitting: Neurology

## 2014-09-10 VITALS — BP 149/91 | HR 55 | Ht 71.0 in | Wt 211.0 lb

## 2014-09-10 DIAGNOSIS — G35D Multiple sclerosis, unspecified: Secondary | ICD-10-CM

## 2014-09-10 DIAGNOSIS — R258 Other abnormal involuntary movements: Secondary | ICD-10-CM | POA: Diagnosis not present

## 2014-09-10 DIAGNOSIS — R252 Cramp and spasm: Secondary | ICD-10-CM

## 2014-09-10 DIAGNOSIS — G35 Multiple sclerosis: Secondary | ICD-10-CM | POA: Diagnosis not present

## 2014-09-10 NOTE — Progress Notes (Signed)
PATIENT: Timothy Love DOB: [birth year]  Chief Complaint  Patient presents with  . Multiple Sclerosis    He has brought his MRI disc in today for review. Feels he is doing well overall.  He does have some intermittent numbness in bilateral hands that sometimes causes him difficulty with writing.    HISTORICAL  Timothy Love is a 54 years old right-handed Caucasian male, referred by his primary care physician Dr. Madelyn Flavors, and orthopedic surgeon Dr. 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 Clent Ridges, MRI of thoracic spine has demonstrate right upper thoracic spine, and the left lower thoracic spine T2 lesions, expending 1 to 2 segments  MRI of the brain with and without contrast in [facility] demonstrated right frontal T2 lesion, left coronal radiata lesions,  there was one right parieto-occipital lobe with thin linear enhancing lesions.  He was not offered any treatment, overall he has much improved, now ambulates with mild unsteadiness, still has numbness at mid chest level, no incontinence, he also has occasional shooting numbness to his bilateral upper extremity, Lhermitte signs when he bends down his neck  I have reviewed the MRI film from [facility] diagnostic imaging in 2013, MRI cervical had patchy area of abnormality, at C2, 3, 4, 5, 6, T1-2.  CSF Nov 2013, more than 5 oligoclonal banding, WBC 9, above findings support definite diagnosis of relapsing remitting multiple sclerosis, he continued to have Lhermitt  signs, ambulate without difficulty, no visual loss,   Visual evoked potential was abnormal, secondary to a prolonged P100 latency on the left. 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    UPDATE April 11 th 2014,  [name] was started in Jan 15th 2014, his insurance will not cover [name], overall he is doing well, he took the medicine Monday Wednesday Friday evening, proceeding with ibuprofen, he complains of mild worsening depression, he also has bilateral lower extremity increased  tghtness. He still works full-time as Personnel officer, performing 95% off work capacity, avoiding high ladder climbing   UPDATE Feb 27th 2015: He was changed to [name] since Nov 2014, mild nause, flushing, after he had a repeat MRI brain and spine in Oct 2014, he had a second opinion at [facility], by [name] He is clinically stable, but on the repeat MRI of the brain, cervical, and thoracic spine, there was progression of disease, this was done in October 2014 at [facility]   MRI thoracic spine, Atrophic appearing thoracic cord with multiple T2 hyperintense lesions consistent with demyelinating plaques. No lesions demonstrate enhancement to indicate active demyelination.Compression deformity of the T10 vertebral body with edema and enhancement in the superior endplate, indicating that this is acute/subacute in nature.  most notably at the T7-8 level appear larger than on the examination from 08/09/11. In addition, the T10 compression deformity described on the current study is new, and was not present on  the 08/09/11 study  MRI cervical spine, the brainstem lesions described on the present examination from 01/03/13 appear new compared to the 02/19/12 study. Multiple lesions throughout the cervical cord appear overall unchanged. multiple T2 hyperintense plaques throughout the cervical  cord and proximal brainstem most notably the craniocervical junction and throughout the cervical cord extending from approximately C2-C6. No enhancement is seen. There is mild multilevel uncovertebral and facet hypertrophy, but without significant canal or foraminal stenosis.   MRI of brain, Multiple T2 hyperintense lesions throughout the brainstem and cervical cord consistent with multiple sclerosis. No enhancement to suggest active demyelination.There is definitely a new lesion in the left periventricular white matter. There are also likely several new punctate lesions adjacent to the anterior horn of the cheeks and right lateral ventricle, however this may be exaggerated given differences in slice thickness between the 2 studies. The infratentorial lesions are not definitively seen on the prior study, however this may again be due to to technical factors.  He still works full times, at times, he complains of fatigue, legs gave up, hands does not cooperative, finger tips and feet paresthesia, gets tired, falling off ladders, loose balance,  No significant bowel and bladder issues, urgerncy.  UPDATE May 8th 2015:  He had MRI at [place], [place], we have reviewed MRI thoracic spine, persistent signal alteration within the cord, in a similar distribution as the prior examination. The overall size of the cord however is not as prominent suggesting possible mild improvement. No new signal alteration is evident, and there is no  abnormal region of enhancement on the postcontrast portion of the examination.  Minimal degenerative disc disease without significant spinal or  foraminal stenosis at any level.   MRI cervical spine: With and without contrast, persistent signal alteration within the cervical cord, in a distribution similar to previous examination, the overall size of the cortex is not as prominent, suggesting possible mild improvement, no new abnormal signals, no contrast enhancement.  MRI of the brain  without contrast report, that he was stable white matter disease, in comparison to August 2013, the distribution of consistent with multiple sclerosis, no contrast enhancement, 5 mm enhancing focus along the undersurface of right frontal lobe, suggesting a tiny meningioma, next lef   clinically patient is stable as well, his fatigue has much improved with ranitidine 5 mg twice a day, no significant gait difficulty, but occasionally bilateral lower extremity muscle spasm, jumpy movement, no visual change  UPDATE Dec 4th 2015: He is dong well, a lot better than 2014, he does not have those frequent episode of left jerking, ritalin 5 mg twice a day he has been very helpful, he was able to work full time  He has mild flushing with [name] treatment.  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  MRI of brain: there is no restricted diffusion to suggest an acute cortical or white matter infarct. The scattered foci of high signal within the subcortical and periventricular white matter are stable. There is no new focus of abnormal signal. There  is no significant atrophy, or ventriculomegaly.    REVIEW OF SYSTEMS: Full 14 system review of systems performed and notable only for fatigue, hearing loss, ringing ears, eye pain, murmur, flash, restless leg, snoring, joint pain, back pain, walking difficulty, itching, memory loss, dizziness, numbness, agitation, confusion, depression, anxiety  ALLERGIES: Allergies  Allergen Reactions  .  Penicillins Other (See Comments)    Child-doesn't know    HOME MEDICATIONS: Current Outpatient Prescriptions  Medication Sig Dispense Refill  . baclofen (LIORESAL) 10 MG tablet Take 1 tablet (10 mg total) by mouth 3 (three) times daily. 270 each 3  . Cholecalciferol (VITAMIN D PO) Take 2,000 mg by mouth daily.     Marland Kitchen FLUoxetine (PROZAC) 40 MG capsule 40 mg.    . gabapentin (NEURONTIN) 300 MG capsule Take 2 capsules (600 mg total) by mouth 3 (three) times daily. 60 capsule 1  . methylphenidate (RITALIN) 5 MG tablet Take 1 tablet (5 mg total) by mouth 2 (two) times daily. 60 tablet 0  . pravastatin (PRAVACHOL) 10 MG tablet Take 10 mg by mouth daily.    . [name] 240 MG CPDR TAKE 1 CAPSULE (240 MG) TWICE A DAY 180 capsule 0   No current facility-administered medications for this visit.    PAST MEDICAL HISTORY: Past Medical History  Diagnosis Date  . MS (multiple sclerosis)   . Unspecified cause of encephalitis, myelitis, and encephalomyelitis   . Depression   . Anxiety   . Kidney stone     PAST SURGICAL HISTORY: No past surgical history on file.  FAMILY HISTORY: Family History  Problem Relation Age of Onset  . High Cholesterol Mother   . Heart Problems Mother   . Heart attack Father     SOCIAL HISTORY:  History   Social History  . Marital Status: Married    Spouse Name: cynthia  . Number of Children: 2  . Years of Education: college   Occupational History  .     Social History Main Topics  . Smoking status: Never Smoker   . Smokeless tobacco: Never Used  . Alcohol Use: No  . Drug Use: No  . Sexual Activity: Not on file   Other Topics Concern  . Not on file   Social History Narrative     PHYSICAL EXAM   Filed Vitals:   09/10/14 0831  BP: 149/91  Pulse: 55  Height: 5\' 11"  (1.803 m)  Weight: 211 lb (95.709 kg)    Not recorded      Body mass index is 29.44 kg/(m^2).  PHYSICAL EXAMNIATION:  Gen: NAD, conversant, well nourised, obese, well  groomed                     Cardiovascular: Regular rate rhythm, no peripheral edema, warm, nontender. Eyes: Conjunctivae clear without exudates or hemorrhage Neck: Supple, no carotid bruise. Pulmonary: Clear to auscultation bilaterally   NEUROLOGICAL EXAM:  MENTAL STATUS: Speech:    Speech is normal; fluent and spontaneous with normal comprehension.  Cognition:    The patient is oriented to person, place, and time;     recent and remote memory intact;     language fluent;     normal attention, concentration,     fund of knowledge.  CRANIAL NERVES: CN II: Visual fields are full to confrontation. Fundoscopic exam is normal with sharp discs and no vascular changes. Pupil were equal round reactive to light CN III, IV, VI: extraocular movement are normal. No ptosis. CN V:  Facial sensation is intact to pinprick in all 3 divisions bilaterally. Corneal responses are intact.  CN VII: Face is symmetric with normal eye closure and smile. CN VIII: Hearing is normal to rubbing fingers CN IX, X: Palate elevates symmetrically. Phonation is normal. CN XI: Head turning and shoulder shrug are intact CN XII: Tongue is midline with normal movements and no atrophy.  MOTOR: There is no pronator drift of out-stretched arms. Muscle bulk and tone are normal. Muscle strength is normal.  REFLEXES: Reflexes are 2+ and symmetric at the biceps, triceps, knees, and ankles. Plantar responses are flexor.  SENSORY: Light touch, pinprick, position sense, and vibration sense are intact in fingers and toes.  COORDINATION: Rapid alternating movements and fine finger movements are intact. There is no dysmetria on finger-to-nose and heel-knee-shin. There are no abnormal or extraneous movements.   GAIT/STANCE: Mild stiffiness gait, able to perform tiptoe, heel, and tandem walking.   DIAGNOSTIC DATA (LABS, IMAGING, TESTING) - I reviewed patient records, labs, notes, testing and imaging myself where  available.  Lab Results  Component Value Date   WBC 4.8 03/06/2014   HGB 14.7 03/06/2014   HCT 43.4 03/06/2014   MCV 87 03/06/2014   PLT 266 03/06/2014      Component Value Date/Time   NA 145* 03/06/2014 1237   K 5.0 03/06/2014 1237   CL 103 03/06/2014 1237   CO2 23 03/06/2014 1237   GLUCOSE 89 03/06/2014 1237   BUN 17 03/06/2014 1237   CREATININE 0.83 03/06/2014 1237   CALCIUM 10.8* 03/06/2014 1237   PROT 7.2 03/06/2014 1237   AST 17 03/06/2014 1237   ALT 18 03/06/2014 1237   ALKPHOS 88 03/06/2014 1237   BILITOT 0.5 03/06/2014 1237   GFRNONAA 100 03/06/2014 1237   GFRAA 116 03/06/2014 1237    ASSESSMENT AND PLAN  Timothy Love is a 54 y.o. male  with relapsing remitting multiple sclerosis, supported by abnormal MRI of the brain, lesions also involve cervical, thoracic spinal cord, spinal fluid testing showed oligoclonal banding. 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 Baclofen 10 mg 3 times a day, 3. Fatigue, Ritalin 5 mg twice a day, he gets prescription from his primary care physician.  4. RTC in 6 months   Levert Feinstein, M.D. Ph.D.  [facility] [address], [address] [place], [place] [address] Ph: [phone] Fax: [phone]

## 2014-09-11 ENCOUNTER — Ambulatory Visit: Payer: BC Managed Care – PPO | Admitting: Neurology

## 2014-09-23 ENCOUNTER — Other Ambulatory Visit: Payer: Self-pay | Admitting: Neurology

## 2015-03-15 ENCOUNTER — Encounter: Payer: Self-pay | Admitting: Adult Health

## 2015-03-15 ENCOUNTER — Telehealth: Payer: Self-pay

## 2015-03-15 ENCOUNTER — Ambulatory Visit (INDEPENDENT_AMBULATORY_CARE_PROVIDER_SITE_OTHER): Payer: BLUE CROSS/BLUE SHIELD | Admitting: Adult Health

## 2015-03-15 VITALS — BP 144/86 | HR 59 | Ht 72.0 in | Wt 216.5 lb

## 2015-03-15 DIAGNOSIS — R202 Paresthesia of skin: Secondary | ICD-10-CM | POA: Diagnosis not present

## 2015-03-15 DIAGNOSIS — G35 Multiple sclerosis: Secondary | ICD-10-CM | POA: Diagnosis not present

## 2015-03-15 MED ORDER — BACLOFEN 10 MG PO TABS: 10.0000 mg | ORAL_TABLET | Freq: Three times a day (TID) | ORAL | Status: DC

## 2015-03-15 MED ORDER — DIMETHYL FUMARATE 240 MG PO CPDR: 1.0000 | DELAYED_RELEASE_CAPSULE | Freq: Two times a day (BID) | ORAL | Status: DC

## 2015-03-15 MED ORDER — GABAPENTIN 300 MG PO CAPS: 600.0000 mg | ORAL_CAPSULE | Freq: Three times a day (TID) | ORAL | Status: DC

## 2015-03-15 NOTE — Progress Notes (Signed)
PATIENT: Timothy Love DOB: April 04, 1960  REASON FOR VISIT: follow up- multiple sclerosis HISTORY FROM: patient  HISTORY OF PRESENT ILLNESS: Timothy Love is a 54 year old male with a history of multiple sclerosis. He returns today for follow-up. The patient continues on [name] and is tolerating it well. He states that he did have a flareup of fatigue starting after Thanksgiving. He states that his primary symptom was fatigue. He did see his primary care who gave him a prednisone Dosepak. He said the prednisone did help his symptoms. Patient reports that he continues to have numbness and tingling in the hands however this is ongoing and not new. He uses gabapentin to help with the discomfort in his hands. He states that he can tell a difference if he skips a dose. The patient has discomfort down the right leg however he contributes this to his sciatic nerve as it only occurs when he is walking for 20 or 25 minutes. The patient denies any changes with his vision. Denies any changes with the bowels or bladder. He states that his balance can be off at times. He does not use an assistive device when ambulating. He continues to use Ritalin for fatigue and feels that it works well. He also uses baclofen  for muscle spasticity. Overall he feels that things have remained stable. He returns today for an evaluation.  HISTORY (YAN): Timothy Love is a 54 years old right-handed Caucasian male, referred by his primary care physician Dr. Madelyn Flavors, and orthopedic surgeon Dr. 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 Clent Ridges, MRI of thoracic spine has demonstrate right upper thoracic spine, and the left lower thoracic spine T2 lesions, expending 1 to 2 segments  MRI of the brain with and without contrast in [facility] demonstrated right frontal T2 lesion, left coronal radiata lesions, there was one right parieto-occipital lobe with thin linear enhancing lesions.  He was not offered any treatment, overall he has much improved, now ambulates with mild unsteadiness, still has numbness at mid chest level, no incontinence, he also has occasional shooting numbness to his bilateral upper extremity, Lhermitte signs when he bends down his neck  I have reviewed the MRI film from [facility] diagnostic imaging in 2013, MRI cervical had patchy area of abnormality, at C2, 3, 4, 5, 6, T1-2.  CSF Nov 2013, more than 5 oligoclonal banding, WBC 9, above findings support definite diagnosis of relapsing remitting multiple sclerosis, he continued to have Lhermitt signs, ambulate without difficulty, no visual loss,   Visual evoked potential was abnormal, secondary to a prolonged P100 latency on the left. 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   UPDATE April 11 th 2014,  [name] was started in Jan 15th 2014, his insurance will not cover [name], overall he is doing well, he took the medicine Monday Wednesday Friday evening, proceeding with ibuprofen, he complains of mild worsening depression,  he also has bilateral lower extremity increased tghtness. He still works full-time as Personnel officer, performing 95% off work capacity, avoiding high ladder climbing   UPDATE Feb 27th 2015: He was changed to [name] since Nov 2014, mild nause, flushing, after he had a repeat MRI brain and spine in Oct 2014, he had a second opinion at [facility]  clinic, by [name] He is clinically stable, but on the repeat MRI of the brain, cervical, and thoracic spine, there was progression of disease, this was done in October 2014 at [facility]   MRI thoracic spine, Atrophic appearing thoracic cord with multiple T2 hyperintense lesions consistent with demyelinating plaques. No lesions demonstrate enhancement to indicate active demyelination.Compression deformity of the T10 vertebral body with edema and enhancement in the superior endplate, indicating that this is acute/subacute in nature. most notably at the T7-8 level appear larger than on the examination from 08/09/11. In addition, the T10 compression deformity described on the current study is new, and was not present on the 08/09/11 study  MRI cervical spine, the brainstem lesions described on the present examination from 01/03/13 appear new compared to the 02/19/12 study. Multiple lesions throughout the cervical cord appear overall unchanged. multiple T2 hyperintense plaques throughout the cervical cord and proximal brainstem most notably the craniocervical junction and throughout the cervical cord extending from approximately C2-C6. No enhancement is seen. There is mild multilevel uncovertebral and facet hypertrophy, but without significant canal or foraminal stenosis.  MRI of brain, Multiple T2 hyperintense lesions throughout the brainstem and cervical cord consistent with multiple sclerosis. No enhancement to suggest active demyelination.There is definitely a new lesion in the left periventricular white matter. There are also likely several new punctate lesions adjacent to the anterior horn of the cheeks and right lateral ventricle, however this may be exaggerated given differences in slice thickness between the 2 studies. The infratentorial lesions are not definitively seen on the prior study, however this may again be due to to technical factors.  He still works full times, at times, he  complains of fatigue, legs gave up, hands does not cooperative, finger tips and feet paresthesia, gets tired, falling off ladders, loose balance, No significant bowel and bladder issues, urgerncy.  UPDATE May 8th 2015:  He had MRI at [place], [place], we have reviewed MRI thoracic spine, persistent signal alteration within the cord, in a similar distribution as the prior examination. The overall size of the cord however is not as prominent suggesting possible mild improvement. No new signal alteration is evident, and there is no  abnormal region of enhancement on the postcontrast portion of the examination. Minimal degenerative disc disease without significant spinal or  foraminal stenosis at any level.   MRI cervical spine: With and without contrast, persistent signal alteration within the cervical cord, in a distribution similar to previous examination, the overall size of the cortex is not as prominent, suggesting possible mild improvement, no new abnormal signals, no contrast enhancement.  MRI of the brain without contrast report, that he was stable white matter disease, in comparison to August 2013, the distribution of consistent with multiple sclerosis, no contrast enhancement, 5 mm enhancing focus along the undersurface of right frontal lobe, suggesting a tiny meningioma, next lef  clinically patient is stable as well, his fatigue has much improved with ranitidine 5 mg twice a day, no significant gait difficulty, but occasionally bilateral lower extremity muscle spasm, jumpy movement, no visual change  UPDATE Dec 4th 2015: He is dong well, a  lot better than 2014, he does not have those frequent episode of left jerking, ritalin 5 mg twice a day he has been very helpful, he was able to work full time  He has mild flushing with [name] treatment.  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  MRI of brain: there is no restricted diffusion to suggest an acute cortical or white matter infarct. The scattered foci of high signal within the subcortical and periventricular white matter are stable. There is no new focus of abnormal signal. There is no significant atrophy, or ventriculomegaly.  REVIEW OF SYSTEMS: Out of a complete 14 system review of symptoms, the patient complains only of the following symptoms, and all other reviewed systems are negative.  Diarrhea, murmur, snoring, walking difficulty, decreased concentration, depression, speech difficulty, numbness, memory loss, fatigue, hearing loss  ALLERGIES: Allergies  Allergen Reactions  . Penicillins Other (See Comments)    Child-doesn't know    HOME MEDICATIONS: Outpatient Prescriptions Prior to Visit  Medication Sig Dispense Refill  . Cholecalciferol (VITAMIN D PO) Take 2,000 mg by mouth daily.     Marland Kitchen FLUoxetine (PROZAC) 40 MG capsule 40 mg.    . methylphenidate (RITALIN) 5 MG tablet Take 1 tablet (5 mg total) by mouth 2 (two) times daily. 60 tablet 0  . pravastatin (PRAVACHOL) 10 MG tablet Take 10 mg by mouth daily.    . baclofen (LIORESAL) 10 MG tablet Take 1 tablet (10 mg total) by mouth 3 (three) times daily. 270 each 3  . gabapentin (NEURONTIN) 300 MG capsule Take 2 capsules (600 mg total) by mouth 3 (three) times daily. 60 capsule 1  . [name] 240 MG CPDR TAKE 1 CAPSULE TWICE A DAY 180 capsule 1   No facility-administered medications prior to visit.    PAST MEDICAL HISTORY: Past Medical History  Diagnosis Date  . MS (multiple sclerosis) (HCC)   .  Unspecified cause of encephalitis, myelitis, and encephalomyelitis   . Depression   . Anxiety   . Kidney stone     PAST SURGICAL HISTORY: No past surgical history on file.  FAMILY HISTORY: Family History  Problem Relation Age of Onset  . High Cholesterol Mother   . Heart Problems Mother   . Heart attack Father     SOCIAL HISTORY: Social History   Social History  . Marital Status: Married    Spouse Name: cynthia  . Number of Children: 2  . Years of Education: college   Occupational History  .     Social History Main Topics  . Smoking status: Never Smoker   . Smokeless tobacco: Never Used  . Alcohol Use: No  . Drug Use: No  . Sexual Activity: Not on file   Other Topics Concern  . Not on file   Social History Narrative      PHYSICAL EXAM  Filed Vitals:   03/15/15 0715  BP: 144/86  Pulse: 59  Height: 6' (1.829 m)  Weight: 216 lb 8 oz (98.204 kg)   Body mass index is 29.36  kg/(m^2).  Generalized: Well developed, in no acute distress   Neurological examination  Mentation: Alert oriented to time, place, history taking. Follows all commands speech and language fluent Cranial nerve II-XII: Pupils were equal round reactive to light. Extraocular movements were full, visual field were full on confrontational test. Facial sensation and strength were normal. Uvula tongue midline. Head turning and shoulder shrug  were normal and symmetric. Motor: The motor testing reveals 5 over 5 strength of all 4 extremities. Good symmetric motor tone is noted throughout.  Sensory: Sensory testing is intact to soft touch on all 4 extremities. No evidence of extinction is noted.  Coordination: Cerebellar testing reveals good finger-nose-finger and heel-to-shin bilaterally.  Gait and station: Gait is normal. Tandem gait is normal. Romberg is negative. No drift is seen.  Reflexes: Deep tendon reflexes are symmetric and normal bilaterally.   DIAGNOSTIC DATA (LABS, IMAGING, TESTING) -  I reviewed patient records, labs, notes, testing and imaging myself where available.  Lab Results  Component Value Date   WBC 4.8 03/06/2014   HGB 14.7 03/06/2014   HCT 43.4 03/06/2014   MCV 87 03/06/2014   PLT 266 03/06/2014      Component Value Date/Time   NA 145* 03/06/2014 1237   K 5.0 03/06/2014 1237   CL 103 03/06/2014 1237   CO2 23 03/06/2014 1237   GLUCOSE 89 03/06/2014 1237   BUN 17 03/06/2014 1237   CREATININE 0.83 03/06/2014 1237   CALCIUM 10.8* 03/06/2014 1237   PROT 7.2 03/06/2014 1237   ALBUMIN 4.9 03/06/2014 1237   AST 17 03/06/2014 1237   ALT 18 03/06/2014 1237   ALKPHOS 88 03/06/2014 1237   BILITOT 0.5 03/06/2014 1237   GFRNONAA 100 03/06/2014 1237   GFRAA 116 03/06/2014 1237      ASSESSMENT AND PLAN 54 y.o. year old male  has a past medical history of MS (multiple sclerosis) (HCC); Unspecified cause of encephalitis, myelitis, and encephalomyelitis; Depression; Anxiety; and Kidney stone. here with:  1. Multiple sclerosis 2. Numbness and tingling in the hands   Overall the patient has remained stable. He will continue on [name]. The patient states that he recently had blood work at the end of October. We will have him sign a release form so we can obtain these results. We will repeat MRI of the brain, cervical and thoracic spine at the next visit. Patient advised that if his symptoms worsen or he develops any new symptoms he should let us know. He will follow-up in 6 months or sooner if needed.  Butch Penny, MSN, NP-C 03/15/2015, 8:01 AM [facility] [address], [address] [place], [place] [address] [phone]

## 2015-03-15 NOTE — Progress Notes (Signed)
I have reviewed and agreed above plan. 

## 2015-03-15 NOTE — Telephone Encounter (Signed)
I spoke to the patient in regards to the [facility] study. The patient stated that he had been called before, and that he is still not interested in participating. I thanked him for his time.

## 2015-03-15 NOTE — Patient Instructions (Signed)
Continue [name] We will try to obtain labwork from PCP. If your symptoms worsen or you develop new symptoms please let us know.

## 2015-05-21 ENCOUNTER — Telehealth: Payer: Self-pay | Admitting: Neurology

## 2015-05-21 NOTE — Telephone Encounter (Signed)
Called [facility] - his medication needs a cost override from his insurance company - the pharmacy is the one who has to complete this process.  I spoke to the MS Team at [facility] and they will take care of it today and schedule an urgent delivery to the patient.  I called the patient back to let him know the status.  He will call back if he has any further problems.

## 2015-05-21 NOTE — Telephone Encounter (Signed)
Pt called and spoke with [facility] pharm about Dimethyl Fumarate ([name]) 240 MG CPDR and was told that the rx needs to be extended. Pt may need a prior auth. Pt has enough to get through the weekend but will not have any on Monday. May call pt at [phone]

## 2015-05-24 ENCOUNTER — Telehealth: Payer: Self-pay | Admitting: Neurology

## 2015-05-24 NOTE — Telephone Encounter (Signed)
Called patient - he has medication today - his [name] shipment will be delivered tomorrow.

## 2015-05-24 NOTE — Telephone Encounter (Signed)
Patient called his pharmacy and they stated that they just got the emergency override from his insurance company.

## 2015-05-24 NOTE — Telephone Encounter (Signed)
Patient called back to advise he is still having problems getting his medication [name].

## 2015-05-24 NOTE — Telephone Encounter (Addendum)
Pt called and has been told by is insurance company that he needs a prior [place]. He is out of medication right now. He has not received the urgent delivery, he says he doesn't think they received the override. I asked the pt to call his pharmacy back so they could issue another emergency override. He expressed understanding. Please call and advise [phone]

## 2015-09-13 ENCOUNTER — Encounter: Payer: Self-pay | Admitting: Adult Health

## 2015-09-13 ENCOUNTER — Ambulatory Visit (INDEPENDENT_AMBULATORY_CARE_PROVIDER_SITE_OTHER): Payer: BLUE CROSS/BLUE SHIELD | Admitting: Adult Health

## 2015-09-13 VITALS — BP 131/80 | HR 60 | Resp 16 | Ht 72.0 in | Wt 214.0 lb

## 2015-09-13 DIAGNOSIS — G35 Multiple sclerosis: Secondary | ICD-10-CM | POA: Diagnosis not present

## 2015-09-13 NOTE — Patient Instructions (Addendum)
Dr. Terrace Arabia will review MRi lumbar spine Please fax lab work to our office Repeat MRI brain, cervical and thoracic spine Continue tecfidera

## 2015-09-13 NOTE — Progress Notes (Addendum)
PATIENT: Timothy Love DOB: [birth year]  REASON FOR VISIT: follow up- multiple sclerosis HISTORY FROM: patient  HISTORY OF PRESENT ILLNESS:  Today 09/13/2015: Timothy Love is a 55 year old male with a history of multiple sclerosis. He returns today for follow-up. He is currently on [name]  and tolerating it well. He states that over the last couple years he feels that the weakness on the right side is getting gradually worse. He states that in the mornings he feels great and he feels great again at 8 PM. He states starting around 10 AM he starts to get fatigued and his symptoms become more prevalent. The patient reports that his primary care did order a lumbar MRI and found 3 additional herniated disks. He is currently being seen at [facility] pain management. He is curious is his symptoms may be more related to the disc herniation rather than multiple sclerosis. He denies any changes with the bowels or bladder. He states on rare occasion he will have bladder incontinence. He denies any significant changes with his vision. Occasionally he may have blurry vision but nothing consistent. He does feel like his walking is affected by the right-sided weakness. He reports having a fall recently however he does not contributes this to his symptoms. He returns today for an evaluation.  09/13/15 (MM): Timothy Love is a 55 year old male with a history of multiple sclerosis. He returns today for follow-up. The patient continues on [name] and is tolerating it well. He states that he did have a flareup of fatigue starting after Thanksgiving. He states that his primary symptom was fatigue. He did see his primary care who gave him a prednisone Dosepak. He said the prednisone did help his symptoms. Patient reports that he continues to have numbness and tingling in the hands however this is ongoing and not new. He uses gabapentin to help with the discomfort in his hands. He states that he can tell a difference if he skips a  dose. The patient has discomfort down the right leg however he contributes this to his sciatic nerve as it only occurs when he is walking for 20 or 25 minutes. The patient denies any changes with his vision. Denies any changes with the bowels or bladder. He states that his balance can be off at times. He does not use an assistive device when ambulating. He continues to use Ritalin for fatigue and feels that it works well. He also uses baclofen for muscle spasticity. Overall he feels that things have remained stable. He returns today for an evaluation.  HISTORY (Per Dr.YAN"S notes):  Timothy Love is a 55 years old right-handed Caucasian male, referred by his primary care physician Dr. Madelyn Flavors, and orthopedic surgeon Dr. 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 Clent Ridges, MRI of thoracic spine has demonstrate right upper thoracic spine, and the left lower thoracic spine T2 lesions, expending 1 to 2 segments  MRI of the brain with and without contrast in [facility] demonstrated right frontal T2 lesion, left coronal radiata  lesions, there was one right parieto-occipital lobe with thin linear enhancing lesions.  He was not offered any treatment, overall he has much improved, now ambulates with mild unsteadiness, still has numbness at mid chest level, no incontinence, he also has occasional shooting numbness to his bilateral upper extremity, Lhermitte signs when he bends down his neck  I have reviewed the MRI film from [facility]  diagnostic imaging in 2013, MRI cervical had patchy area of abnormality, at C2, 3, 4, 5, 6, T1-2.  CSF Nov 2013, more than 5 oligoclonal banding, WBC 9, above findings support definite diagnosis of relapsing remitting multiple sclerosis, he continued to have Lhermitt signs, ambulate without difficulty, no visual loss,   Visual evoked potential was abnormal, secondary to a prolonged P100 latency on the left. 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   UPDATE April 11 th 2014,  [name] was started in Jan 15th 2014, his insurance will not cover [name], overall he is doing well, he took the medicine Monday Wednesday Friday evening, proceeding with ibuprofen, he complains of mild worsening depression, he also has bilateral lower extremity increased tghtness. He still works full-time as Personnel officer, performing 95% off work capacity, avoiding high ladder climbing   UPDATE Feb 27th 2015: He was changed to [name] since Nov 2014, mild nause, flushing, after he had a repeat MRI brain and spine in Oct 2014, he had a second opinion at [facility] clinic, by [name] He is clinically stable, but on the repeat MRI of the brain, cervical, and thoracic spine, there was progression of disease, this was done in October 2014 at [facility]   MRI thoracic spine, Atrophic appearing thoracic cord with multiple T2 hyperintense lesions consistent with demyelinating plaques. No lesions demonstrate enhancement to indicate active demyelination.Compression deformity of the T10 vertebral body with edema and enhancement in the superior endplate, indicating that this is acute/subacute in nature. most notably at the T7-8 level appear larger than on the examination from 08/09/11. In addition, the T10 compression deformity described on the current study is new, and was not present on the 08/09/11  study  MRI cervical spine, the brainstem lesions described on the present examination from 01/03/13 appear new compared to the 02/19/12 study. Multiple lesions throughout the cervical cord appear overall unchanged. multiple T2 hyperintense plaques throughout the cervical cord and proximal brainstem most notably the craniocervical junction and throughout the cervical cord extending from approximately C2-C6. No enhancement is seen. There is mild multilevel uncovertebral and facet hypertrophy, but without significant canal or foraminal stenosis.  MRI of brain, Multiple T2 hyperintense lesions throughout the brainstem and cervical cord consistent with multiple sclerosis. No enhancement to suggest active demyelination.There is definitely a new lesion in the left periventricular white matter. There are also likely several new punctate lesions adjacent to the anterior horn of the cheeks and right lateral ventricle, however this may be exaggerated given differences in slice thickness between the 2 studies. The infratentorial lesions are not definitively seen on the prior study, however this may again be due to to technical factors.  He still works full times, at times, he complains of fatigue, legs gave up, hands does not cooperative, finger tips and feet paresthesia, gets tired, falling off ladders, loose balance, No significant bowel and bladder issues, urgerncy.  UPDATE May 8th 2015:  He had MRI at [place], [place], we have reviewed  MRI thoracic spine, persistent signal alteration within the cord, in a similar distribution as the prior examination. The overall size of the cord however is not as prominent suggesting possible mild improvement. No new signal alteration is evident, and there is no  abnormal region of enhancement on the postcontrast portion of the examination. Minimal degenerative disc disease without significant spinal or  foraminal stenosis at any level.   MRI cervical spine: With and without  contrast, persistent signal alteration within the cervical cord, in a distribution similar to previous examination, the overall size of the cortex is not as prominent, suggesting possible mild improvement, no new abnormal signals, no contrast enhancement.  MRI of the brain without contrast report, that he was stable white matter disease, in comparison to August 2013, the distribution of consistent with multiple sclerosis, no contrast enhancement, 5 mm enhancing focus along the undersurface of right frontal lobe, suggesting a tiny meningioma, next lef  clinically patient is stable as well, his fatigue has much improved with ranitidine 5 mg twice a day, no significant gait difficulty, but occasionally bilateral lower extremity muscle spasm, jumpy movement, no visual change  UPDATE Dec 4th 2015: He is dong well, a lot better than 2014, he does not have those frequent episode of left jerking, ritalin 5 mg twice a day he has been very helpful, he was able to work full time  He has mild flushing with [name] treatment.  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  MRI of brain: there is no restricted diffusion to suggest an acute cortical or white matter infarct. The scattered foci of high signal within the subcortical and periventricular white matter are stable. There is no new focus of abnormal signal. There is no significant atrophy, or ventriculomegaly.   REVIEW OF SYSTEMS: Out of a complete 14 system review of symptoms, the patient  complains only of the following symptoms, and all other reviewed systems are negative.  Fatigue, blurred vision, ringing in ears, restless leg, flushing, incontinence of bladder, back pain, decreased concentration, depression, numbness, speech difficulty   ALLERGIES: Allergies  Allergen Reactions  . Penicillins Other (See Comments)    Child-doesn't know    HOME MEDICATIONS: Outpatient Prescriptions Prior to Visit  Medication Sig Dispense Refill  . baclofen (LIORESAL) 10 MG tablet Take 1 tablet (10 mg total) by mouth 3 (three) times daily. 270 each 3  . Cholecalciferol (VITAMIN D PO) Take 2,000 Units by mouth daily.     . Dimethyl Fumarate ([name]) 240 MG CPDR Take 1 capsule (240 mg total) by mouth 2 (two) times daily. 60 capsule 5  . FLUoxetine (PROZAC) 40 MG capsule 40 mg.    . gabapentin (NEURONTIN) 300 MG capsule Take 2 capsules (600 mg total) by mouth 3 (three) times daily. 540 capsule 3  . methylphenidate (RITALIN) 5 MG tablet Take 1 tablet (5 mg total) by mouth 2 (two) times daily. 60 tablet 0  . pravastatin (PRAVACHOL) 10 MG tablet Take 10 mg by mouth daily.    . cyanocobalamin 2000 MCG tablet  Take 2,000 mcg by mouth daily.    . predniSONE (DELTASONE) 10 MG tablet      No facility-administered medications prior to visit.    PAST MEDICAL HISTORY: Past Medical History  Diagnosis Date  . MS (multiple sclerosis) (HCC)   . Unspecified cause of encephalitis, myelitis, and encephalomyelitis   . Depression   . Anxiety   . Kidney stone     PAST SURGICAL HISTORY: No past surgical history on file.  FAMILY HISTORY: Family History  Problem Relation Age of Onset  . High Cholesterol Mother   . Heart Problems Mother   . Heart attack Father     SOCIAL HISTORY: Social History   Social History  . Marital Status: Married    Spouse Name: cynthia  . Number of Children: 2  . Years of Education: college   Occupational History  .     Social History Main Topics  .  Smoking status: Never Smoker   . Smokeless tobacco: Never Used  . Alcohol Use: No  . Drug Use: No  . Sexual Activity: Not on file   Other Topics Concern  . Not on file   Social History Narrative      PHYSICAL EXAM  Filed Vitals:   09/13/15 0724  BP: 131/80  Pulse: 60  Resp: 16  Height: 6' (1.829 m)  Weight: 214 lb (97.07 kg)   Body mass index is 29.02 kg/(m^2).  Generalized: Well developed, in no acute distress   Neurological examination  Mentation: Alert oriented to time, place, history taking. Follows all commands speech and language fluent Cranial nerve II-XII: Pupils were equal round reactive to light. Extraocular movements were full, visual field were full on confrontational test. Facial sensation and strength were normal. Uvula tongue midline. Head turning and shoulder shrug  were normal and symmetric. Motor: The motor testing reveals 5 over 5 strength of all 4 extremities. Good symmetric motor tone is noted throughout.  Sensory: Sensory testing is intact to soft touch on all 4 extremities. No evidence of extinction is noted.  Coordination: Cerebellar testing reveals good finger-nose-finger and heel-to-shin bilaterally.  Gait and station: Gait is normal. Tandem gait is normal. Romberg is negative. No drift is seen.  Reflexes: Deep tendon reflexes are symmetric and normal bilaterally.   DIAGNOSTIC DATA (LABS, IMAGING, TESTING) - I reviewed patient records, labs, notes, testing and imaging myself where available.     ASSESSMENT AND PLAN 55 y.o. year old male  has a past medical history of MS (multiple sclerosis) (HCC); Unspecified cause of encephalitis, myelitis, and encephalomyelitis; Depression; Anxiety; and Kidney stone. here with:  1. Multiple sclerosis  The patient's physical exam is relatively unremarkable today. I do not note any right-sided weakness during the visit. The patient has not had repeat MRIs since 2015. I will recheck an MRI of the brain,  cervical and thoracic spine. He recently had blood work with his primary care I have asked that he have this faxed to our office. The patient did bring a lumbar MRI with him today. I will have Dr. Terrace Arabia look over this. He will continue on tecfidera. Patient advised that if his symptoms worsen or he develops any new symptoms he will let us know. He will follow-up in 6 months or sooner if needed.    Timothy Penny, MSN, NP-C 09/13/2015, 7:31 AM [facility] [address] [place], [place] [address] [phone]   I reviewed the above note and documentation by the Nurse Practitioner and  agree with the history, physical exam, assessment and plan as outlined above. I was immediately available for face-to-face consultation. Star Age, MD, PhD [facility])

## 2015-09-21 NOTE — Progress Notes (Signed)
I have reviewed and agreed above plan. 

## 2015-09-22 ENCOUNTER — Telehealth: Payer: Self-pay | Admitting: *Deleted

## 2015-09-22 NOTE — Telephone Encounter (Signed)
Labs received from [facility] in [place], [place] (collected on 07/27/15):  LIVER PANEL: Normal  LIPID PANEL: Cholesterol: 217 LDL: 125  BMP: Creatinine: 0.82  PSA SCREEN: 0.28

## 2015-10-06 ENCOUNTER — Telehealth: Payer: Self-pay | Admitting: *Deleted

## 2015-10-06 ENCOUNTER — Other Ambulatory Visit: Payer: Self-pay | Admitting: Adult Health

## 2015-10-06 NOTE — Telephone Encounter (Signed)
Results indicated:  Stable scattered areas of white matter disease considering the patient's history of MS without evidence of enhancement to suggest active plaques.   No active plaques. Please call patient with results. No change in treatment at this time.

## 2015-10-06 NOTE — Telephone Encounter (Signed)
Received via fax MRI brain w/wo results, placed on MM/NP in box.

## 2015-10-07 NOTE — Telephone Encounter (Signed)
LMVM for pt to return call for MRI results.  

## 2015-10-08 NOTE — Telephone Encounter (Signed)
Pt returned call.   I gave him the results of his MRI as per below.  (stable scattered areas of white matter disease relating to MS, no active plaques.  No change in treatment at this time.  He verbalized that this was good.

## 2015-10-13 ENCOUNTER — Telehealth: Payer: Self-pay | Admitting: Adult Health

## 2015-10-13 NOTE — Telephone Encounter (Signed)
I spoke to pt and relayed that no active disease process going on with MRI cervical and thoracic.  (no new areas of enhancement).  Pt verbalized understanding.

## 2015-10-13 NOTE — Telephone Encounter (Signed)
  Please call the patient let him know  that there has been no active demyelination with the MRI of the thoracic and cervical spine.   MRI of the cervical spine impression: stable areas of high T2 and STIR signal throughout the cervical cord are consistent with the patient's history of multiple sclerosis. No areas of enhancement to suggest active demyelination are noted.  MRI of this thoracic spine impression indicates stable areas of high signal in the thoracic spinal cord similar to prior study. No new areas of enhancement noted to suggest active multiple sclerosis. Stable compression deformities at T7 and T10. Increased STIR signal along the anterior endplate of T11, nonspecific.

## 2016-01-07 ENCOUNTER — Telehealth: Payer: Self-pay | Admitting: Neurology

## 2016-01-07 NOTE — Telephone Encounter (Signed)
Please take care of his [name] refill

## 2016-01-07 NOTE — Telephone Encounter (Signed)
Patient requesting refill of [name] 240 MG CPDR [facility] Fax: [phone]  New Insurance:   [employer]  Member Id: [id number]  RX BIN : [id number]

## 2016-01-10 ENCOUNTER — Other Ambulatory Visit: Payer: Self-pay | Admitting: *Deleted

## 2016-01-10 MED ORDER — DIMETHYL FUMARATE 240 MG PO CPDR: 1.0000 | DELAYED_RELEASE_CAPSULE | Freq: Two times a day (BID) | ORAL | 3 refills | Status: DC

## 2016-01-10 NOTE — Telephone Encounter (Signed)
Refill sent to [facility].

## 2016-01-24 ENCOUNTER — Encounter: Payer: Self-pay | Admitting: Adult Health

## 2016-01-27 NOTE — Telephone Encounter (Signed)
Called [employer] to complete PA.  Approval valid through 07/27/16 - [id number].  Patient aware and will call to schedule his overnight delivery.

## 2016-01-27 NOTE — Telephone Encounter (Signed)
Pt's wife called to advise the medication needs PA. He also advised he is out of medication. [employer] [facility] (p) [phone]. She said if a call could be made this morning the med could be shipped this afternoon. Please call her when this is complete  Thank you

## 2016-03-14 ENCOUNTER — Telehealth: Payer: Self-pay | Admitting: *Deleted

## 2016-03-14 ENCOUNTER — Ambulatory Visit (INDEPENDENT_AMBULATORY_CARE_PROVIDER_SITE_OTHER): Payer: Managed Care, Other (non HMO) | Admitting: Adult Health

## 2016-03-14 ENCOUNTER — Encounter: Payer: Self-pay | Admitting: Adult Health

## 2016-03-14 VITALS — BP 138/80 | HR 58 | Resp 16 | Ht 72.0 in | Wt 208.0 lb

## 2016-03-14 DIAGNOSIS — G35 Multiple sclerosis: Secondary | ICD-10-CM | POA: Diagnosis not present

## 2016-03-14 DIAGNOSIS — M79604 Pain in right leg: Secondary | ICD-10-CM | POA: Diagnosis not present

## 2016-03-14 DIAGNOSIS — R269 Unspecified abnormalities of gait and mobility: Secondary | ICD-10-CM

## 2016-03-14 DIAGNOSIS — Z5181 Encounter for therapeutic drug level monitoring: Secondary | ICD-10-CM

## 2016-03-14 MED ORDER — DULOXETINE HCL 30 MG PO CPEP: 30.0000 mg | ORAL_CAPSULE | Freq: Every day | ORAL | 3 refills | Status: DC

## 2016-03-14 MED ORDER — MELOXICAM 15 MG PO TABS: 15.0000 mg | ORAL_TABLET | Freq: Every day | ORAL | 3 refills | Status: DC

## 2016-03-14 MED ORDER — BACLOFEN 10 MG PO TABS: 10.0000 mg | ORAL_TABLET | Freq: Three times a day (TID) | ORAL | 3 refills | Status: DC

## 2016-03-14 MED ORDER — GABAPENTIN 300 MG PO CAPS: 600.0000 mg | ORAL_CAPSULE | Freq: Three times a day (TID) | ORAL | 3 refills | Status: DC

## 2016-03-14 NOTE — Progress Notes (Signed)
PATIENT: Timothy Love DOB: 1961-02-12  REASON FOR VISIT: follow up- multiple sclerosis HISTORY FROM: patient  HISTORY OF PRESENT ILLNESS:  HISTORY Timothy Love is a 55 years old right-handed Caucasian male, referred by his primary care physician Dr. Madelyn Flavors, and orthopedic surgeon Dr. 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 Clent Ridges, MRI of thoracic spine has demonstrate right upper thoracic spine, and the left lower thoracic spine T2 lesions, expending 1 to 2 segments  MRI of the brain with and without contrast in [facility] demonstrated right frontal T2 lesion, left coronal radiata lesions, there was one right parieto-occipital lobe with thin linear enhancing lesions.  He was not offered any treatment, overall he has much improved, now ambulates with mild unsteadiness, still has numbness at mid chest level, no incontinence, he also has occasional shooting numbness to his bilateral upper extremity, Lhermitte signs when he bends down his neck  I have reviewed the MRI film from [facility] diagnostic imaging in 2013, MRI cervical had patchy area of abnormality, at C2, 3, 4, 5, 6, T1-2.  CSF Nov 2013, more than 5 oligoclonal banding, WBC 9, above findings support definite diagnosis of relapsing remitting multiple sclerosis, he continued to have Lhermitt signs, ambulate without difficulty, no visual loss,   Visual evoked potential was abnormal, secondary to a prolonged P100 latency on the left. 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   UPDATE April 11 th 2014,  [name] was started in Jan 15th 2014, his insurance will not cover [name], overall he is doing well, he took the medicine Monday Wednesday Friday evening, proceeding with ibuprofen, he complains of mild worsening depression, he also has bilateral lower extremity increased tghtness. He still works full-time as Personnel officer, performing 95% off work capacity, avoiding high ladder climbing   UPDATE Feb 27th 2015: He was changed to [name] since Nov 2014, mild nause, flushing, after he had a repeat MRI brain and spine in Oct 2014, he had a second opinion at [facility], by [name] He is clinically stable, but on the repeat MRI of the brain, cervical, and thoracic spine, there was progression of disease, this was done in October 2014 at [facility]   MRI thoracic spine, Atrophic appearing thoracic cord with multiple T2 hyperintense lesions consistent with demyelinating plaques. No lesions demonstrate enhancement to indicate active demyelination.Compression deformity of the T10 vertebral body with edema and enhancement in the superior endplate, indicating that this is acute/subacute in nature. most notably at the T7-8 level appear larger than on the examination from 08/09/11. In addition, the T10 compression deformity described on the current study is new, and was not present on the 08/09/11 study  MRI cervical spine, the brainstem lesions described on the present examination from 01/03/13 appear new compared to the 02/19/12 study. Multiple lesions throughout the cervical cord appear overall unchanged. multiple T2 hyperintense plaques throughout  the cervical cord and proximal brainstem most notably the craniocervical junction and throughout the cervical cord extending from approximately C2-C6. No enhancement is  seen. There is mild multilevel uncovertebral and facet hypertrophy, but without significant canal or foraminal stenosis.  MRI of brain, Multiple T2 hyperintense lesions throughout the brainstem and cervical cord consistent with multiple sclerosis. No enhancement to suggest active demyelination.There is definitely a new lesion in the left periventricular white matter. There are also likely several new punctate lesions adjacent to the anterior horn of the cheeks and right lateral ventricle, however this may be exaggerated given differences in slice thickness between the 2 studies. The infratentorial lesions are not definitively seen on the prior study, however this may again be due to to technical factors.  He still works full times, at times, he complains of fatigue, legs gave up, hands does not cooperative, finger tips and feet paresthesia, gets tired, falling off ladders, loose balance, No significant bowel and bladder issues, urgerncy.  UPDATE May 8th 2015:  He had MRI at [place], [place], we have reviewed MRI thoracic spine, persistent signal alteration within the cord, in a similar distribution as the prior examination. The overall size of the cord however is not as prominent suggesting possible mild improvement. No new signal alteration is evident, and there is no  abnormal region of enhancement on the postcontrast portion of the examination. Minimal degenerative disc disease without significant spinal or  foraminal stenosis at any level.   MRI cervical spine: With and without contrast, persistent signal alteration within the cervical cord, in a distribution similar to previous examination, the overall size of the cortex is not as prominent, suggesting possible mild improvement, no new abnormal signals, no contrast enhancement.  MRI of the brain without contrast report, that he was stable white matter disease, in comparison to August 2013, the distribution of consistent with multiple sclerosis, no  contrast enhancement, 5 mm enhancing focus along the undersurface of right frontal lobe, suggesting a tiny meningioma, next lef  clinically patient is stable as well, his fatigue has much improved with ranitidine 5 mg twice a day, no significant gait difficulty, but occasionally bilateral lower extremity muscle spasm, jumpy movement, no visual change    UPDATE Dec 4th 2015: He is dong well, a lot better than 2014, he does not have those frequent episode of left jerking, ritalin 5 mg twice a day he has been very helpful, he was able to work full time  He has mild flushing with [name] treatment.  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  MRI of brain: there is no restricted diffusion to suggest an acute cortical or white matter infarct. The scattered foci of high signal within the subcortical and periventricular white matter are stable. There is no new focus of abnormal signal. There is no significant atrophy, or ventriculomegaly.  09/13/15 (MM): Timothy Love is a 55 year old male with a history of multiple sclerosis. He returns today for follow-up. The patient continues on [name] and is tolerating it well. He states that he did  have a flareup of fatigue starting after Thanksgiving. He states that his primary symptom was fatigue. He did see his primary care who gave him a prednisone Dosepak. He said the prednisone did help his symptoms. Patient reports that he continues to have numbness and tingling in the hands however this  is ongoing and not new. He uses gabapentin to help with the discomfort in his hands. He states that he can tell a difference if he skips a dose. The patient has discomfort down the right leg however he contributes this to his sciatic nerve as it only occurs when he is walking for 20 or 25 minutes. The patient denies any changes with his vision. Denies any changes with the bowels or bladder. He states that his balance can be off at times. He does not use an assistive device when ambulating. He continues to use Ritalin for fatigue and feels that it works well. He also uses baclofen for muscle spasticity. Overall he feels that things have remained stable. He returns today for an evaluation.  09/13/2015: Timothy Love is a 55 year old male with a history of multiple sclerosis. He returns today for follow-up. He is currently on [name]  and tolerating it well. He states that over the last couple years he feels that the weakness on the right side is getting gradually worse. He states that in the mornings he feels great and he feels great again at 8 PM. He states starting around 10 AM he starts to get fatigued and his symptoms become more prevalent. The patient reports that his primary care did order a lumbar MRI and found 3 additional herniated disks. He is currently being seen at [facility] pain management. He is curious is his symptoms may be more related to the disc herniation rather than multiple sclerosis. He denies any changes with the bowels or bladder. He states on rare occasion he will have bladder incontinence. He denies any significant changes with his vision. Occasionally he may have blurry vision but nothing consistent. He does feel like his walking is affected by the right-sided weakness. He reports having a fall recently however he does not contributes this to his symptoms. He returns today for an evaluation.  Today 03/14/2016:  Timothy Love is a 55 year old male with a history of multiple sclerosis. He  returns today for follow-up. Patient reports that he continues to have issues with the right leg. 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 He was sent to Dr. Venetia Maxon for follow-up. He reports that there is no surgical option at this time. He does report that Dr. Venetia Maxon did give him to epidural steroid injections however the benefit only lasted 3 days. He states that during this 3 days he did notice that he did not have any discomfort in his walking improved. He also tried physical therapy and reports that it was beneficial however he has not been doing the exercises at home. He states that the further he walks he will start to drag the right leg. And he will develop a limp. He also reports spasms in the legs that occur occasionally. Denies any changes with the bowels or bladder. He does report blurry vision however this is not a new finding. He continues on Tecfiderawith good benefit. He had a repeat MRI of the cervical and thoracic spine that did not show any active lesions. I have not seen the MRI of the lumbar spine. He returns today for an evaluation.   REVIEW  OF SYSTEMS: Out of a complete 14 system review of symptoms, the patient complains only of the following symptoms, and all other reviewed systems are negative.  Restless leg, snoring, walking difficulty, incontinence of bowels, speech difficulty, weakness, numbness, dizziness  ALLERGIES: Allergies  Allergen Reactions  . Penicillins Other (See Comments)    Child-doesn't know    HOME MEDICATIONS: Outpatient Medications Prior to Visit  Medication Sig Dispense Refill  . Cholecalciferol (VITAMIN D PO) Take 2,000 Units by mouth daily.     . Dimethyl Fumarate ([name]) 240 MG CPDR Take 1 capsule (240 mg total) by mouth 2 (two) times daily. 180 capsule 3  . FLUoxetine (PROZAC) 40 MG capsule 40 mg.    . methylphenidate (RITALIN) 5 MG tablet Take 1 tablet (5 mg total) by mouth 2 (two) times daily. 60 tablet 0  .  pravastatin (PRAVACHOL) 10 MG tablet Take 10 mg by mouth daily.    . baclofen (LIORESAL) 10 MG tablet Take 1 tablet (10 mg total) by mouth 3 (three) times daily. 270 each 3  . gabapentin (NEURONTIN) 300 MG capsule Take 2 capsules (600 mg total) by mouth 3 (three) times daily. 540 capsule 3   No facility-administered medications prior to visit.     PAST MEDICAL HISTORY: Past Medical History:  Diagnosis Date  . Anxiety   . Depression   . Kidney stone   . MS (multiple sclerosis) (HCC)   . Unspecified cause of encephalitis, myelitis, and encephalomyelitis     PAST SURGICAL HISTORY: No past surgical history on file.  FAMILY HISTORY: Family History  Problem Relation Age of Onset  . High Cholesterol Mother   . Heart Problems Mother   . Heart attack Father     SOCIAL HISTORY: Social History   Social History  . Marital status: Married    Spouse name: cynthia  . Number of children: 2  . Years of education: college   Occupational History  .  [employer]   Social History Main Topics  . Smoking status: Never Smoker  . Smokeless tobacco: Never Used  . Alcohol use No  . Drug use: No  . Sexual activity: Not on file   Other Topics Concern  . Not on file   Social History Narrative  . No narrative on file      PHYSICAL EXAM  Vitals:   03/14/16 0726  BP: 138/80  Pulse: (!) 58  Resp: 16  Weight: 208 lb (94.3 kg)  Height: 6' (1.829 m)   Body mass index is 28.21 kg/m.  Generalized: Well developed, in no acute distress   Neurological examination  Mentation: Alert oriented to time, place, history taking. Follows all commands speech and language fluent Cranial nerve II-XII: Pupils were equal round reactive to light. Extraocular movements were full, visual field were full on confrontational test. Facial sensation and strength were normal. Uvula tongue midline. Head turning and shoulder shrug  were normal and symmetric. Motor: The motor testing reveals 5  over 5 strength of all 4 extremities. Good symmetric motor tone is noted throughout.  Sensory: Sensory testing is intact to soft touch on all 4 extremities. No evidence of extinction is noted.  Coordination: Cerebellar testing reveals good finger-nose-finger and heel-to-shin bilaterally.  Gait and station: The patient has a slight limp on the right when ambulating. After doing 2 laps around our office he had a slight drag with the right foot. Tandem gait is normal. Romberg is negative. Unable to walk on the toes.  Heel walking normal.  Reflexes: Deep tendon reflexes are symmetric and normal bilaterally.   DIAGNOSTIC DATA (LABS, IMAGING, TESTING) - I reviewed patient records, labs, notes, testing and imaging myself where available.  Lab Results  Component Value Date   WBC 4.8 03/06/2014   HGB 14.7 03/06/2014   HCT 43.4 03/06/2014   MCV 87 03/06/2014   PLT 266 03/06/2014      Component Value Date/Time   NA 145 (H) 03/06/2014 1237   K 5.0 03/06/2014 1237   CL 103 03/06/2014 1237   CO2 23 03/06/2014 1237   GLUCOSE 89 03/06/2014 1237   BUN 17 03/06/2014 1237   CREATININE 0.83 03/06/2014 1237   CALCIUM 10.8 (H) 03/06/2014 1237   PROT 7.2 03/06/2014 1237   ALBUMIN 4.9 03/06/2014 1237   AST 17 03/06/2014 1237   ALT 18 03/06/2014 1237   ALKPHOS 88 03/06/2014 1237   BILITOT 0.5 03/06/2014 1237   GFRNONAA 100 03/06/2014 1237   GFRAA 116 03/06/2014 1237     ASSESSMENT AND PLAN 55 y.o. year old male  has a past medical history of Anxiety; Depression; Kidney stone; MS (multiple sclerosis) (HCC); and Unspecified cause of encephalitis, myelitis, and encephalomyelitis. here with:  1. Multiple sclerosis 2. Abnormality of gait 3. Right leg pain  The patient will continue on [name]. I will check blood work today. I did consult with Dr. Terrace ArabiaYan. She recommended that we order nerve conduction studies with EMG of the right leg. She also suggested that we start Mobic 50 mg daily and Cymbalta 30  mg daily to see if this offers any benefit. I've also asked the patient to bring a copy of his lumbar MRI (CD) to the next office visit. Patient will follow-up in 2-3 months with Dr. Terrace ArabiaYan.      Butch PennyMegan Jennavieve Arrick, MSN, NP-C 03/14/2016, 5:06 PM [facility] [address], [address] [place], [place] [address] [phone]

## 2016-03-14 NOTE — Patient Instructions (Addendum)
Have your PCP send over MRI lumbar scan over to our office.  Continue [name]  Blood work today Do exercises throughout day that you learned at PT If your symptoms worsen or you develop new symptoms please let us know.

## 2016-03-14 NOTE — Telephone Encounter (Signed)
Received copy of report for MRI Lumbar spine done 08-2015.  I called and spoke to imaging center at [phone], if he had other tests done.  She will fax to [phone] MRI brain, cervical and thoracic done in 2017.

## 2016-03-15 LAB — CBC WITH DIFFERENTIAL/PLATELET
Basophils Absolute: 0 10*3/uL (ref 0.0–0.2)
Basos: 1 %
EOS (ABSOLUTE): 0.1 10*3/uL (ref 0.0–0.4)
EOS: 2 %
HEMATOCRIT: 43.2 % (ref 37.5–51.0)
HEMOGLOBIN: 14.5 g/dL (ref 13.0–17.7)
IMMATURE GRANS (ABS): 0 10*3/uL (ref 0.0–0.1)
IMMATURE GRANULOCYTES: 0 %
Lymphocytes Absolute: 0.5 10*3/uL — ABNORMAL LOW (ref 0.7–3.1)
Lymphs: 11 %
MCH: 29.2 pg (ref 26.6–33.0)
MCHC: 33.6 g/dL (ref 31.5–35.7)
MCV: 87 fL (ref 79–97)
MONOS ABS: 0.5 10*3/uL (ref 0.1–0.9)
Monocytes: 11 %
NEUTROS PCT: 75 %
Neutrophils Absolute: 3 10*3/uL (ref 1.4–7.0)
Platelets: 221 10*3/uL (ref 150–379)
RBC: 4.97 x10E6/uL (ref 4.14–5.80)
RDW: 13.4 % (ref 12.3–15.4)
WBC: 4.1 10*3/uL (ref 3.4–10.8)

## 2016-03-15 LAB — COMPREHENSIVE METABOLIC PANEL
ALBUMIN: 4.7 g/dL (ref 3.5–5.5)
ALT: 14 IU/L (ref 0–44)
AST: 16 IU/L (ref 0–40)
Albumin/Globulin Ratio: 2.1 (ref 1.2–2.2)
Alkaline Phosphatase: 89 IU/L (ref 39–117)
BUN/Creatinine Ratio: 32 — ABNORMAL HIGH (ref 9–20)
BUN: 24 mg/dL (ref 6–24)
Bilirubin Total: 0.7 mg/dL (ref 0.0–1.2)
CALCIUM: 9.9 mg/dL (ref 8.7–10.2)
CO2: 27 mmol/L (ref 18–29)
CREATININE: 0.74 mg/dL — AB (ref 0.76–1.27)
Chloride: 102 mmol/L (ref 96–106)
GFR calc Af Amer: 120 mL/min/{1.73_m2} (ref >59)
GFR, EST NON AFRICAN AMERICAN: 104 mL/min/{1.73_m2} (ref >59)
GLOBULIN, TOTAL: 2.2 g/dL (ref 1.5–4.5)
Glucose: 83 mg/dL (ref 65–99)
Potassium: 4.5 mmol/L (ref 3.5–5.2)
SODIUM: 143 mmol/L (ref 134–144)
Total Protein: 6.9 g/dL (ref 6.0–8.5)

## 2016-03-15 NOTE — Progress Notes (Signed)
I have reviewed and agreed above plan. 

## 2016-03-16 ENCOUNTER — Telehealth: Payer: Self-pay | Admitting: *Deleted

## 2016-03-16 NOTE — Telephone Encounter (Signed)
Per M Millikan, NP, LVM informing patient his lab results are unremarkable. Left name, number for any questions. 

## 2016-05-12 ENCOUNTER — Encounter (INDEPENDENT_AMBULATORY_CARE_PROVIDER_SITE_OTHER): Payer: Self-pay | Admitting: Neurology

## 2016-05-12 ENCOUNTER — Ambulatory Visit (INDEPENDENT_AMBULATORY_CARE_PROVIDER_SITE_OTHER): Payer: Managed Care, Other (non HMO) | Admitting: Neurology

## 2016-05-12 DIAGNOSIS — R269 Unspecified abnormalities of gait and mobility: Secondary | ICD-10-CM

## 2016-05-12 DIAGNOSIS — M79604 Pain in right leg: Secondary | ICD-10-CM

## 2016-05-12 DIAGNOSIS — Z0289 Encounter for other administrative examinations: Secondary | ICD-10-CM

## 2016-05-12 NOTE — Procedures (Signed)
Full Name: Timothy Love Gender: Male MRN #: [id number] Date of Birth: [birth year]    Visit Date: 05/12/2016 08:23 Age: 56 Years 0 Months Old Examining Physician: Levert Feinstein, MD  Referring Physician: Levert Feinstein History: 56 year old male, with history of relapsing remediating multiple sclerosis, complains of right lower extremity clumsiness, he denies significant sensory changes or low back pain.  Summary of test:  Nerve conduction study: Bilateral sural, peroneal sensory responses were normal. Bilateral tibial motor responses were normal. Left peroneal to EDB motor response showed significantly decreased C map amplitude, he had mild atrophy of left extensor digitorum brevis muscle. Right peroneal to EDB showed decreased her response at about fibular head stimulation side, This could due to suboptimal stimulation.  Electromyography: Selected needle examination of bilateral lower extremity muscles and bilateral lumbar sacral paraspinal muscles showed no significant abnormality.     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     ------------------------------- Levert Feinstein, M.D.  [facility] [address] [place], [place] [address] Tel: [phone] Fax: [phone]        [facility]    Nerve / Sites Rec. Site Peak Lat Ref. Amp.1-2 Ref. Distance    ms ms V V cm  R Sural - Ankle (Calf)     Calf Ankle 2.92 ?4.40 8.6 ?6.0 14  L Sural - Ankle (Calf)     Calf Ankle 3.18 ?4.40 12.1 ?6.0 14  R Superficial peroneal - Ankle     Lat leg Ankle 3.70 ?4.40 4.8 ?6.0 14  L Superficial peroneal - Ankle     Lat leg Ankle 3.59 ?4.40 6.6 ?6.0 14     [facility]    Nerve / Sites Muscle Latency Ref. Amplitude Ref. Rel Amp Segments Distance Lat Diff Velocity Ref. Area    ms ms mV mV %  cm ms m/s m/s  mVms  R Peroneal - EDB     Ankle EDB 5.5 ?6.5 5.0 ?2.0 100 Ankle - EDB 9    17.4     Fib head EDB 12.9  4.1  81.9 Fib head - Ankle 32 7.3 44 ?44 15.9     Pop fossa EDB 17.5  0.3  7.72 Pop fossa - Fib head 11 4.6 24 ?44 1.0         Pop fossa - Ankle  12.0     L Peroneal - EDB     Ankle EDB 8.9 ?6.5 0.5 ?2.0 100 Ankle - EDB 9    1.2     Fib head EDB 16.2  0.3  70.6 Fib head - Ankle 32 7.3 44 ?44 0.8     Pop fossa EDB 18.9  0.2  52.5 Pop fossa - Fib head 11 2.7 41 ?44 1.3         Pop fossa - Ankle  10.1     R Tibial - AH     Ankle AH 5.7 ?5.8 10.0 ?4.0 100 Ankle - AH 9    24.3     Pop fossa AH 14.9  6.4  63.9 Pop fossa - Ankle 38 9.2 41 ?41 22.5  L Tibial - AH     Ankle AH 4.4 ?5.8 9.2 ?4.0 100 Ankle - AH 9    23.9     Pop fossa AH 15.1  5.9  63.5  Pop fossa - Ankle 38 10.6 36 ?41 18.9     F  Wave    Nerve F Lat Ref.   ms ms  R Peroneal - EDB 57.8 ?56.0  R Tibial - AH 58.4 ?56.0  L Tibial - AH 58.8 ?56.0  L Peroneal - EDB 63.3 ?56.0     EMG       EMG Summary Table    Spontaneous MUAP Recruitment  Muscle IA Fib PSW Fasc Other Amp Dur. Poly Pattern  L. Tibialis anterior Normal None None None _______ Normal Normal Normal Normal  L. Gastrocnemius (Medial head) Normal None None None _______ Normal Normal Normal Normal  L. Peroneus longus Normal None None None _______ Normal Normal Normal Normal  L. Vastus lateralis Normal None None None _______ Normal Normal Normal Normal  R. Tibialis anterior Normal None None None _______ Normal Normal Normal Normal  R. Tibialis posterior Normal None None None _______ Normal Normal Normal Normal  R. Peroneus longus Normal None None None _______ Normal Normal Normal Normal  R. Vastus lateralis Normal None None None _______ Normal Normal Normal Normal  R. Lumbar paraspinals (low) Normal None None None _______ Normal Normal Normal Normal  R. Lumbar paraspinals (mid) Normal None None None _______ Normal Normal Normal Normal  L. Lumbar paraspinals (low)  Normal None None None _______ Normal Normal Normal Normal  L. Lumbar paraspinals (mid) Normal None None None _______ Normal Normal Normal Normal

## 2016-06-12 ENCOUNTER — Encounter: Payer: Self-pay | Admitting: Neurology

## 2016-06-12 ENCOUNTER — Ambulatory Visit (INDEPENDENT_AMBULATORY_CARE_PROVIDER_SITE_OTHER): Payer: Managed Care, Other (non HMO) | Admitting: Neurology

## 2016-06-12 VITALS — BP 155/86 | HR 58 | Ht 72.0 in | Wt 217.0 lb

## 2016-06-12 DIAGNOSIS — R252 Cramp and spasm: Secondary | ICD-10-CM

## 2016-06-12 DIAGNOSIS — G35 Multiple sclerosis: Secondary | ICD-10-CM | POA: Diagnosis not present

## 2016-06-12 DIAGNOSIS — R5383 Other fatigue: Secondary | ICD-10-CM

## 2016-06-12 NOTE — Progress Notes (Signed)
PATIENT: Timothy Love DOB: 1961-02-12  REASON FOR VISIT: follow up- multiple sclerosis HISTORY FROM: patient  HISTORY OF PRESENT ILLNESS:  HISTORY Timothy Love is a 56 years old right-handed Caucasian male, referred by his primary care physician Dr. Madelyn Flavors, and orthopedic surgeon Dr. 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 Clent Ridges, MRI of thoracic spine has demonstrate right upper thoracic spine, and the left lower thoracic spine T2 lesions, expending 1 to 2 segments  MRI of the brain with and without contrast in [facility] demonstrated right frontal T2 lesion, left coronal radiata lesions, there was one right parieto-occipital lobe with thin linear enhancing lesions.  He was not offered any treatment, overall he has much improved, now ambulates with mild unsteadiness, still has numbness at mid chest level, no incontinence, he also has occasional shooting numbness to his bilateral upper extremity, Lhermitte signs when he bends down his neck  I have reviewed the MRI film from [facility] diagnostic imaging in 2013, MRI cervical had patchy area of abnormality, at C2, 3, 4, 5, 6, T1-2.  CSF Nov 2013, more than 5 oligoclonal banding, WBC 9, above findings support definite diagnosis of relapsing remitting multiple sclerosis, he continued to have Lhermitt signs, ambulate without difficulty, no visual loss,   Visual evoked potential was abnormal, secondary to a prolonged P100 latency on the left. 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   UPDATE April 11 th 2014,  [name] was started in Jan 15th 2014, his insurance will not cover [name], overall he is doing well, he took the medicine Monday Wednesday Friday evening, proceeding with ibuprofen, he complains of mild worsening depression, he also has bilateral lower extremity increased tghtness. He still works full-time as Personnel officer, performing 95% off work capacity, avoiding high ladder climbing   UPDATE Feb 27th 2015: He was changed to [name] since Nov 2014, mild nause, flushing, after he had a repeat MRI brain and spine in Oct 2014, he had a second opinion at [facility], by [name] He is clinically stable, but on the repeat MRI of the brain, cervical, and thoracic spine, there was progression of disease, this was done in October 2014 at [facility]   MRI thoracic spine, Atrophic appearing thoracic cord with multiple T2 hyperintense lesions consistent with demyelinating plaques. No lesions demonstrate enhancement to indicate active demyelination.Compression deformity of the T10 vertebral body with edema and enhancement in the superior endplate, indicating that this is acute/subacute in nature. most notably at the T7-8 level appear larger than on the examination from 08/09/11. In addition, the T10 compression deformity described on the current study is new, and was not present on the 08/09/11 study  MRI cervical spine, the brainstem lesions described on the present examination from 01/03/13 appear new compared to the 02/19/12 study. Multiple lesions throughout the cervical cord appear overall unchanged. multiple T2 hyperintense plaques throughout  the cervical cord and proximal brainstem most notably the craniocervical junction and throughout the cervical cord extending from approximately C2-C6. No enhancement is  seen. There is mild multilevel uncovertebral and facet hypertrophy, but without significant canal or foraminal stenosis.  MRI of brain, Multiple T2 hyperintense lesions throughout the brainstem and cervical cord consistent with multiple sclerosis. No enhancement to suggest active demyelination.There is definitely a new lesion in the left periventricular white matter. There are also likely several new punctate lesions adjacent to the anterior horn of the cheeks and right lateral ventricle, however this may be exaggerated given differences in slice thickness between the 2 studies. The infratentorial lesions are not definitively seen on the prior study, however this may again be due to to technical factors.  He still works full times, at times, he complains of fatigue, legs gave up, hands does not cooperative, finger tips and feet paresthesia, gets tired, falling off ladders, loose balance, No significant bowel and bladder issues, urgerncy.  UPDATE May 8th 2015:  He had MRI at [place], [place], we have reviewed MRI thoracic spine, persistent signal alteration within the cord, in a similar distribution as the prior examination. The overall size of the cord however is not as prominent suggesting possible mild improvement. No new signal alteration is evident, and there is no  abnormal region of enhancement on the postcontrast portion of the examination. Minimal degenerative disc disease without significant spinal or  foraminal stenosis at any level.   MRI cervical spine: With and without contrast, persistent signal alteration within the cervical cord, in a distribution similar to previous examination, the overall size of the cortex is not as prominent, suggesting possible mild improvement, no new abnormal signals, no contrast enhancement.  MRI of the brain without contrast report, that he was stable white matter disease, in comparison to August 2013, the distribution of consistent with multiple sclerosis, no  contrast enhancement, 5 mm enhancing focus along the undersurface of right frontal lobe, suggesting a tiny meningioma, next lef  clinically patient is stable as well, his fatigue has much improved with ranitidine 5 mg twice a day, no significant gait difficulty, but occasionally bilateral lower extremity muscle spasm, jumpy movement, no visual change    UPDATE Dec 4th 2015: He is dong well, a lot better than 2014, he does not have those frequent episode of left jerking, ritalin 5 mg twice a day he has been very helpful, he was able to work full time  He has mild flushing with [name] treatment.  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  MRI of brain: there is no restricted diffusion to suggest an acute cortical or white matter infarct. The scattered foci of high signal within the subcortical and periventricular white matter are stable. There is no new focus of abnormal signal. There is no significant atrophy, or ventriculomegaly.  muscle spasticity. Overall he feels that things have remained stable. He returns today for an evaluation.  Update February twelfth 2018: He has no clear significant clinical flareup, but over the past few years,  he has gradual worsening spastic gait, especially the right leg, he tends to drag his right leg, also mild right and arm weakness and numbness  We have personally reviewed MRI with and without contrast in 2017, MRI of the brain mild supratentorium lesions contrast enhancement MRI of  cervical and thoracic spine: Patchy area of cord hyperintensity, no contrast enhancement MRI of lumbar: Multilevel degenerative disc disease, no canal stenosis, mild foraminal stenosis at different levels  EMG nerve conduction study in February 2018 showed no significant abnormality, no evidence of lumbar stenosis.  He complains of fatigue, taking Ritalin 5 mg twice a day, bilateral lower extremity spasticity taking baclofen 10 mg 3 times a day, gabapentin 300 mg 2 tablets 3 times a day  Has been on [name] treatment, tolerating it well.  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  Fatigue, light sensitivity, ringing ears, drooling, heat intolerance, restless leg, walking difficulty, snoring, anxiety, numbness, weakness  ALLERGIES: Allergies  Allergen Reactions  . Penicillins Other (See Comments)    Child-doesn't know    HOME MEDICATIONS: Outpatient Medications Prior to Visit  Medication Sig Dispense Refill  . baclofen (LIORESAL) 10 MG tablet Take 1 tablet (10 mg total) by mouth 3 (three) times daily. 270 each 3  . Cholecalciferol (VITAMIN D PO) Take 2,000 Units by mouth daily.     . Dimethyl Fumarate ([name]) 240 MG CPDR Take 1 capsule (240 mg total) by mouth 2 (two) times daily. 180 capsule 3  . DULoxetine (CYMBALTA) 30 MG capsule Take 1 capsule (30 mg total) by mouth daily. 30 capsule 3  . FLUoxetine (PROZAC) 40 MG capsule 40 mg.    . gabapentin (NEURONTIN) 300 MG capsule Take 2 capsules (600 mg total) by mouth 3 (three) times daily. 540 capsule 3  . meloxicam (MOBIC) 15 MG tablet Take 1 tablet (15 mg total) by mouth daily. 30 tablet 3  . methylphenidate (RITALIN) 5 MG tablet Take 1 tablet (5 mg total) by mouth 2 (two) times daily. 60 tablet 0  . pravastatin (PRAVACHOL) 10 MG tablet Take 10 mg by mouth  daily.     No facility-administered medications prior to visit.     PAST MEDICAL HISTORY: Past Medical History:  Diagnosis Date  . Anxiety   . Depression   . Kidney stone   . MS (multiple sclerosis) (HCC)   . Unspecified cause of encephalitis, myelitis, and encephalomyelitis     PAST SURGICAL HISTORY: No past surgical history on file.  FAMILY HISTORY: Family History  Problem Relation Age of Onset  . High Cholesterol Mother   . Heart Problems Mother   . Heart attack Father     SOCIAL HISTORY: Social History   Social History  . Marital status: Married    Spouse name: cynthia  . Number of children: 2  . Years of education: college   Occupational History  .  [employer]   Social History Main Topics  . Smoking status: Never Smoker  . Smokeless tobacco: Never Used  . Alcohol use No  . Drug use: No  . Sexual activity: Not on file   Other Topics Concern  . Not on file   Social History Narrative  . No narrative on file      PHYSICAL EXAM  Vitals:   06/12/16 0825  BP: (!) 155/86  Pulse: (!) 58  Weight: 217 lb (98.4 kg)  Height: 6' (1.829 m)   Body mass index is 29.43 kg/m.   PHYSICAL EXAMNIATION:  Gen: NAD, conversant, well nourised, obese, well groomed                     Cardiovascular: Regular rate rhythm, no peripheral edema, warm, nontender. Eyes: Conjunctivae clear without exudates or hemorrhage Neck: Supple, no carotid bruits. Pulmonary: Clear to auscultation bilaterally   NEUROLOGICAL EXAM:  MENTAL STATUS: Speech:    Speech is normal; fluent and spontaneous with normal comprehension.  Cognition:     Orientation to time, place and person     Normal recent and remote memory     Normal Attention span and concentration     Normal Language, naming, repeating,spontaneous speech     Fund of knowledge   CRANIAL NERVES: CN II: Visual fields are full to confrontation. Fundoscopic exam is normal with sharp discs and no vascular  changes. Pupils are round equal and briskly reactive to light. CN III, IV, VI: extraocular movement are normal. No ptosis. CN V: Facial sensation is intact to pinprick in all 3 divisions bilaterally. Corneal responses are intact.  CN VII: Face is symmetric with normal eye closure and smile. CN VIII: Hearing is normal to rubbing fingers CN IX, X: Palate elevates symmetrically. Phonation is normal. CN XI: Head turning and shoulder shrug are intact CN XII: Tongue is midline with normal movements and no atrophy.  MOTOR: He has mild spasticity of bilateral lower extremity, He has mild bilateral shoulder abduction, external rotation weakness, right worse than left, he has mild bilateral hip flexion weakness, right worse than left, mild right knee flexion, right ankle dorsiflexion weakness  REFLEXES: Reflexes are 3 and symmetric at the biceps, triceps, knees, and ankles. Plantar responses are extensor bilaterally.  SENSORY: Intact to light touch, pinprick, positional and vibratory sensation are intact in fingers and toes.  COORDINATION: Rapid alternating movements and fine finger movements are intact. There is no dysmetria on finger-to-nose and heel-knee-shin.    GAIT/STANCE: Spastic mild unsteady cautious gait Romberg signs: Mildly positive    DIAGNOSTIC DATA (LABS, IMAGING, TESTING) - I reviewed patient records, labs, notes, testing and imaging myself where available.  Lab Results  Component Value Date   WBC 4.1 03/14/2016   HGB 14.7 03/06/2014   HCT 43.2 03/14/2016   MCV 87 03/14/2016   PLT 221 03/14/2016      Component Value Date/Time   NA 143 03/14/2016 0821   K 4.5 03/14/2016 0821   CL 102 03/14/2016 0821   CO2 27 03/14/2016 0821   GLUCOSE 83 03/14/2016 0821   BUN 24 03/14/2016 0821   CREATININE 0.74 (L) 03/14/2016 0821   CALCIUM 9.9 03/14/2016 0821   PROT 6.9 03/14/2016 0821   ALBUMIN 4.7 03/14/2016 0821   AST 16 03/14/2016 0821   ALT 14 03/14/2016 0821   ALKPHOS  89 03/14/2016 0821   BILITOT 0.7 03/14/2016 0821   GFRNONAA 104 03/14/2016 0821   GFRAA 120 03/14/2016 0821     ASSESSMENT AND PLAN 56 y.o. year old male   Relapsing remitting multiple sclerosis  He has slow decline of functional status with increased fatigue, gait abnormality despite [name] treatment.  I have discussed with him options, as is JC virus, Ocrevus would be a better choice, may also consider Tysarbri IV infusion  Laboratory evaluation today  Return to clinic in 2-3 months for further discussion  Fatigue  Ritalin 5 mg twice a day  Spasticity, gait abnormality  Baclofen 10 mg 3 times a day; gabapentin 300 mg 2 tablets 3 times a day,

## 2016-06-13 LAB — T-HELPER CELLS CD4/CD8 %
% CD 4 Pos. Lymph.: 38.9 % (ref 30.8–58.5)
Absolute CD 4 Helper: 195 /uL — ABNORMAL LOW (ref 359–1519)
BASOS: 1 %
Basophils Absolute: 0 10*3/uL (ref 0.0–0.2)
CD3+CD4+ Cells/CD3+CD8+ Cells Bld: 11.44 — ABNORMAL HIGH (ref 0.92–3.72)
CD3+CD8+ CELLS NFR BLD: 3.4 % — AB (ref 12.0–35.5)
CD3+CD8+ Cells # Bld: 17 /uL — ABNORMAL LOW (ref 109–897)
EOS (ABSOLUTE): 0.1 10*3/uL (ref 0.0–0.4)
EOS: 3 %
Hematocrit: 45.2 % (ref 37.5–51.0)
Hemoglobin: 14.8 g/dL (ref 13.0–17.7)
IMMATURE GRANS (ABS): 0 10*3/uL (ref 0.0–0.1)
IMMATURE GRANULOCYTES: 0 %
LYMPHS: 11 %
Lymphocytes Absolute: 0.5 10*3/uL — ABNORMAL LOW (ref 0.7–3.1)
MCH: 28.9 pg (ref 26.6–33.0)
MCHC: 32.7 g/dL (ref 31.5–35.7)
MCV: 88 fL (ref 79–97)
Monocytes Absolute: 0.4 10*3/uL (ref 0.1–0.9)
Monocytes: 8 %
NEUTROS PCT: 77 %
Neutrophils Absolute: 3.8 10*3/uL (ref 1.4–7.0)
Platelets: 218 10*3/uL (ref 150–379)
RBC: 5.12 x10E6/uL (ref 4.14–5.80)
RDW: 13.1 % (ref 12.3–15.4)
WBC: 4.9 10*3/uL (ref 3.4–10.8)

## 2016-06-13 LAB — COMPREHENSIVE METABOLIC PANEL
A/G RATIO: 2.1 (ref 1.2–2.2)
ALK PHOS: 76 IU/L (ref 39–117)
ALT: 22 IU/L (ref 0–44)
AST: 17 IU/L (ref 0–40)
Albumin: 4.8 g/dL (ref 3.5–5.5)
BUN/Creatinine Ratio: 37 — ABNORMAL HIGH (ref 9–20)
BUN: 31 mg/dL — ABNORMAL HIGH (ref 6–24)
Bilirubin Total: 0.7 mg/dL (ref 0.0–1.2)
CO2: 24 mmol/L (ref 18–29)
CREATININE: 0.83 mg/dL (ref 0.76–1.27)
Calcium: 9.8 mg/dL (ref 8.7–10.2)
Chloride: 100 mmol/L (ref 96–106)
GFR calc Af Amer: 114 mL/min/{1.73_m2} (ref >59)
GFR calc non Af Amer: 98 mL/min/{1.73_m2} (ref >59)
GLOBULIN, TOTAL: 2.3 g/dL (ref 1.5–4.5)
Glucose: 84 mg/dL (ref 65–99)
POTASSIUM: 4.9 mmol/L (ref 3.5–5.2)
SODIUM: 141 mmol/L (ref 134–144)
Total Protein: 7.1 g/dL (ref 6.0–8.5)

## 2016-06-13 LAB — HEPATITIS B CORE ANTIBODY, TOTAL: Hep B Core Total Ab: NEGATIVE

## 2016-06-13 LAB — VARICELLA ZOSTER ANTIBODY, IGG: VARICELLA: 3675 {index} (ref >165)

## 2016-06-13 LAB — HEPATITIS B SURFACE ANTIGEN: Hepatitis B Surface Ag: NEGATIVE

## 2016-06-13 LAB — HEPATITIS B SURFACE ANTIBODY,QUALITATIVE: Hep B Surface Ab, Qual: NONREACTIVE

## 2016-06-13 LAB — TSH: TSH: 1.58 u[IU]/mL (ref 0.450–4.500)

## 2016-06-14 LAB — QUANTIFERON TB GOLD ASSAY (BLOOD)

## 2016-06-14 LAB — QUANTIFERON IN TUBE
QFT TB AG MINUS NIL VALUE: 0 IU/mL
QUANTIFERON MITOGEN VALUE: 1.81 IU/mL
QUANTIFERON TB AG VALUE: 0.02 [IU]/mL
QUANTIFERON TB GOLD: NEGATIVE
Quantiferon Nil Value: 0.02 IU/mL

## 2016-06-19 ENCOUNTER — Telehealth: Payer: Self-pay | Admitting: *Deleted

## 2016-06-19 NOTE — Telephone Encounter (Signed)
Labs collected 06/12/16:  JCV 2.78 H - positive

## 2016-07-20 ENCOUNTER — Telehealth: Payer: Self-pay | Admitting: *Deleted

## 2016-07-20 NOTE — Telephone Encounter (Signed)
PA for [name] approved by [employer] [phone]) through 07/20/17 - case [id number] - pt [id number]#[id number].

## 2016-08-30 ENCOUNTER — Other Ambulatory Visit: Payer: Self-pay | Admitting: Adult Health

## 2016-09-04 ENCOUNTER — Other Ambulatory Visit: Payer: Self-pay | Admitting: Neurology

## 2016-09-12 ENCOUNTER — Encounter: Payer: Self-pay | Admitting: Neurology

## 2016-09-12 ENCOUNTER — Ambulatory Visit (INDEPENDENT_AMBULATORY_CARE_PROVIDER_SITE_OTHER): Payer: BLUE CROSS/BLUE SHIELD | Admitting: Neurology

## 2016-09-12 VITALS — BP 134/84 | HR 59 | Ht 72.0 in | Wt 220.0 lb

## 2016-09-12 DIAGNOSIS — G35 Multiple sclerosis: Secondary | ICD-10-CM

## 2016-09-12 NOTE — Progress Notes (Signed)
PATIENT: Timothy Love DOB: 1961-02-12  REASON FOR VISIT: follow up- multiple sclerosis HISTORY FROM: patient  HISTORY OF PRESENT ILLNESS:  HISTORY Trevoris Pieters is a 56 years old right-handed Caucasian male, referred by his primary care physician Dr. Madelyn Flavors, and orthopedic surgeon Dr. 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 Clent Ridges, MRI of thoracic spine has demonstrate right upper thoracic spine, and the left lower thoracic spine T2 lesions, expending 1 to 2 segments  MRI of the brain with and without contrast in [facility] demonstrated right frontal T2 lesion, left coronal radiata lesions, there was one right parieto-occipital lobe with thin linear enhancing lesions.  He was not offered any treatment, overall he has much improved, now ambulates with mild unsteadiness, still has numbness at mid chest level, no incontinence, he also has occasional shooting numbness to his bilateral upper extremity, Lhermitte signs when he bends down his neck  I have reviewed the MRI film from [facility] diagnostic imaging in 2013, MRI cervical had patchy area of abnormality, at C2, 3, 4, 5, 6, T1-2.  CSF Nov 2013, more than 5 oligoclonal banding, WBC 9, above findings support definite diagnosis of relapsing remitting multiple sclerosis, he continued to have Lhermitt signs, ambulate without difficulty, no visual loss,   Visual evoked potential was abnormal, secondary to a prolonged P100 latency on the left. 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   UPDATE April 11 th 2014,  [name] was started in Jan 15th 2014, his insurance will not cover [name], overall he is doing well, he took the medicine Monday Wednesday Friday evening, proceeding with ibuprofen, he complains of mild worsening depression, he also has bilateral lower extremity increased tghtness. He still works full-time as Personnel officer, performing 95% off work capacity, avoiding high ladder climbing   UPDATE Feb 27th 2015: He was changed to [name] since Nov 2014, mild nause, flushing, after he had a repeat MRI brain and spine in Oct 2014, he had a second opinion at [facility], by [name] He is clinically stable, but on the repeat MRI of the brain, cervical, and thoracic spine, there was progression of disease, this was done in October 2014 at [facility]   MRI thoracic spine, Atrophic appearing thoracic cord with multiple T2 hyperintense lesions consistent with demyelinating plaques. No lesions demonstrate enhancement to indicate active demyelination.Compression deformity of the T10 vertebral body with edema and enhancement in the superior endplate, indicating that this is acute/subacute in nature. most notably at the T7-8 level appear larger than on the examination from 08/09/11. In addition, the T10 compression deformity described on the current study is new, and was not present on the 08/09/11 study  MRI cervical spine, the brainstem lesions described on the present examination from 01/03/13 appear new compared to the 02/19/12 study. Multiple lesions throughout the cervical cord appear overall unchanged. multiple T2 hyperintense plaques throughout  the cervical cord and proximal brainstem most notably the craniocervical junction and throughout the cervical cord extending from approximately C2-C6. No enhancement is  seen. There is mild multilevel uncovertebral and facet hypertrophy, but without significant canal or foraminal stenosis.  MRI of brain, Multiple T2 hyperintense lesions throughout the brainstem and cervical cord consistent with multiple sclerosis. No enhancement to suggest active demyelination.There is definitely a new lesion in the left periventricular white matter. There are also likely several new punctate lesions adjacent to the anterior horn of the cheeks and right lateral ventricle, however this may be exaggerated given differences in slice thickness between the 2 studies. The infratentorial lesions are not definitively seen on the prior study, however this may again be due to to technical factors.  He still works full times, at times, he complains of fatigue, legs gave up, hands does not cooperative, finger tips and feet paresthesia, gets tired, falling off ladders, loose balance, No significant bowel and bladder issues, urgerncy.  UPDATE May 8th 2015:  He had MRI at [place], [place], we have reviewed MRI thoracic spine, persistent signal alteration within the cord, in a similar distribution as the prior examination. The overall size of the cord however is not as prominent suggesting possible mild improvement. No new signal alteration is evident, and there is no  abnormal region of enhancement on the postcontrast portion of the examination. Minimal degenerative disc disease without significant spinal or  foraminal stenosis at any level.   MRI cervical spine: With and without contrast, persistent signal alteration within the cervical cord, in a distribution similar to previous examination, the overall size of the cortex is not as prominent, suggesting possible mild improvement, no new abnormal signals, no contrast enhancement.  MRI of the brain without contrast report, that he was stable white matter disease, in comparison to August 2013, the distribution of consistent with multiple sclerosis, no  contrast enhancement, 5 mm enhancing focus along the undersurface of right frontal lobe, suggesting a tiny meningioma, next lef  clinically patient is stable as well, his fatigue has much improved with ranitidine 5 mg twice a day, no significant gait difficulty, but occasionally bilateral lower extremity muscle spasm, jumpy movement, no visual change   UPDATE Dec 4th 2015: He is dong well, a lot better than 2014, he does not have those frequent episode of left jerking, ritalin 5 mg twice a day he has been very helpful, he was able to work full time  He has mild flushing with [name] treatment.  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  MRI of brain: there is no restricted diffusion to suggest an acute cortical or white matter infarct. The scattered foci of high signal within the subcortical and periventricular white matter are stable. There is no new focus of abnormal signal. There is no significant atrophy, or ventriculomegaly.  muscle spasticity. Overall he feels that things have remained stable. He returns today for an evaluation.  Update February 12th 2018: He has no clear significant clinical flareup, but over the past few years, he  has gradual worsening spastic gait, especially the right leg, he tends to drag his right leg, also mild right and arm weakness and numbness  We have personally reviewed MRI with and without contrast in 2017, MRI of the brain mild supratentorium lesions contrast enhancement MRI of  cervical and thoracic spine: Patchy area of cord hyperintensity, no contrast enhancement MRI of lumbar: Multilevel degenerative disc disease, no canal stenosis, mild foraminal stenosis at different levels  EMG nerve conduction study in February 2018 showed no significant abnormality, no evidence of lumbar stenosis.  He complains of fatigue, taking Ritalin 5 mg twice a day, bilateral lower extremity spasticity taking baclofen 10 mg 3 times a day, gabapentin 300 mg 2 tablets 3 times a day  Has been on [name] treatment, tolerating it well.  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  UPDATE September 12 2016: He continue complains of mild gait abnormality, dragging his right leg, difficulty climbing up steps, he is hesitate to start more aggressive treatment for his Relapsing Remitting Multiple Sclerosis, concern of potential side effect, we went over the potential side effect of Ocrelizumab in detail  Extensive laboratory evaluations in March 2018, showed negative QuantiFERON antibody, hepatitis B surface antigen, positive VZV IgG antibody, normal TSH, CMP,  REVIEW OF SYSTEMS: Out of a complete 14 system review of symptoms, the patient complains only of the following symptoms, and all other reviewed systems are negative.  As above  ALLERGIES: Allergies  Allergen Reactions  . Penicillins Other (See Comments)    Child-doesn't know    HOME MEDICATIONS: Outpatient Medications Prior to Visit  Medication Sig Dispense Refill  . baclofen (LIORESAL) 10 MG tablet Take 1 tablet (10 mg total) by mouth 3 (three) times daily. 270 each 3  . Cholecalciferol (VITAMIN D PO) Take 2,000 Units by mouth daily.     . Dimethyl Fumarate ([name]) 240 MG CPDR Take 1 capsule (240 mg total) by mouth 2 (two) times daily. 180 capsule 3  . FLUoxetine (PROZAC) 40 MG capsule 40 mg.    . gabapentin (NEURONTIN) 300 MG capsule TAKE 2 CAPSULES THREE TIMES A DAY 540 capsule 3  . methylphenidate (RITALIN) 5 MG  tablet Take 1 tablet (5 mg total) by mouth 2 (two) times daily. 60 tablet 0  . pravastatin (PRAVACHOL) 10 MG tablet Take 10 mg by mouth daily.    . baclofen (LIORESAL) 10 MG tablet TAKE 1 TABLET THREE TIMES A DAY (Patient not taking: Reported on 09/12/2016) 270 tablet 3  . meloxicam (MOBIC) 15 MG tablet Take 1 tablet (15 mg total) by mouth daily. (Patient not taking: Reported on 09/12/2016) 30 tablet 3  . [name] 240 MG CPDR TAKE 1 CAPSULE TWICE A DAY (Patient not taking: Reported on 09/12/2016) 60 capsule 11   No facility-administered medications prior to visit.     PAST MEDICAL HISTORY: Past Medical History:  Diagnosis Date  . Anxiety   . Depression   . Kidney stone   . MS (multiple sclerosis) (HCC)   . Unspecified cause of encephalitis, myelitis, and encephalomyelitis     PAST SURGICAL HISTORY: History reviewed. No pertinent surgical history.  FAMILY HISTORY: Family History  Problem Relation Age of Onset  . High Cholesterol Mother   . Heart Problems Mother   . Heart attack Father     SOCIAL HISTORY: Social History   Social History  . Marital status: Married    Spouse name: cynthia  . Number of children: 2  . Years of  education: college   Occupational History  .  [employer]   Social History Main Topics  . Smoking status: Never Smoker  . Smokeless tobacco: Never Used  . Alcohol use No  . Drug use: No  . Sexual activity: Not on file   Other Topics Concern  . Not on file   Social History Narrative  . No narrative on file      PHYSICAL EXAM  Vitals:   09/12/16 0729  BP: 134/84  Pulse: (!) 59  Weight: 220 lb (99.8 kg)  Height: 6' (1.829 m)   Body mass index is 29.84 kg/m.   PHYSICAL EXAMNIATION:  Gen: NAD, conversant, well nourised, obese, well groomed                     Cardiovascular: Regular rate rhythm, no peripheral edema, warm, nontender. Eyes: Conjunctivae clear without exudates or hemorrhage Neck: Supple, no carotid  bruits. Pulmonary: Clear to auscultation bilaterally   NEUROLOGICAL EXAM:  MENTAL STATUS: Speech:    Speech is normal; fluent and spontaneous with normal comprehension.  Cognition:     Orientation to time, place and person     Normal recent and remote memory     Normal Attention span and concentration     Normal Language, naming, repeating,spontaneous speech     Fund of knowledge   CRANIAL NERVES: CN II: Visual fields are full to confrontation. Fundoscopic exam is normal with sharp discs and no vascular changes. Pupils are round equal and briskly reactive to light. CN III, IV, VI: extraocular movement are normal. No ptosis. CN V: Facial sensation is intact to pinprick in all 3 divisions bilaterally. Corneal responses are intact.  CN VII: Face is symmetric with normal eye closure and smile. CN VIII: Hearing is normal to rubbing fingers CN IX, X: Palate elevates symmetrically. Phonation is normal. CN XI: Head turning and shoulder shrug are intact CN XII: Tongue is midline with normal movements and no atrophy.  MOTOR: He has mild spasticity of bilateral lower extremity, He has mild bilateral shoulder abduction, external rotation weakness, right worse than left, he has mild bilateral hip flexion weakness, right worse than left, mild right knee flexion, right ankle dorsiflexion weakness  REFLEXES: Reflexes are 3 and symmetric at the biceps, triceps, knees, and ankles. Plantar responses are extensor bilaterally.  SENSORY: Intact to light touch, pinprick, positional and vibratory sensation are intact in fingers and toes.  COORDINATION: Rapid alternating movements and fine finger movements are intact. There is no dysmetria on finger-to-nose and heel-knee-shin.    GAIT/STANCE: Spastic mild unsteady cautious gait Romberg signs: Mildly positive    DIAGNOSTIC DATA (LABS, IMAGING, TESTING) - I reviewed patient records, labs, notes, testing and imaging myself where available.  Lab  Results  Component Value Date   WBC 4.9 06/12/2016   HGB 14.8 06/12/2016   HCT 45.2 06/12/2016   MCV 88 06/12/2016   PLT 218 06/12/2016      Component Value Date/Time   NA 141 06/12/2016 1022   K 4.9 06/12/2016 1022   CL 100 06/12/2016 1022   CO2 24 06/12/2016 1022   GLUCOSE 84 06/12/2016 1022   BUN 31 (H) 06/12/2016 1022   CREATININE 0.83 06/12/2016 1022   CALCIUM 9.8 06/12/2016 1022   PROT 7.1 06/12/2016 1022   ALBUMIN 4.8 06/12/2016 1022   AST 17 06/12/2016 1022   ALT 22 06/12/2016 1022   ALKPHOS 76 06/12/2016 1022   BILITOT 0.7 06/12/2016 1022  GFRNONAA 98 06/12/2016 1022   GFRAA 114 06/12/2016 1022     ASSESSMENT AND PLAN 56 y.o. year old male   Relapsing remitting multiple sclerosis  He has slow decline of functional status with increased fatigue, gait abnormality, right leg weaknes despite [name] treatment.  I have discussed with him options, with his positive JC-virus, with titer more than 3.15, Ocrevus would be a better choice,    Repeat MRis.  Return to clinic in 2-3 months for further discussion   Spasticity, gait abnormality  Baclofen 10 mg 3 times a day; gabapentin 300 mg 2 tablets 3 times a day,

## 2016-09-18 ENCOUNTER — Encounter: Payer: Self-pay | Admitting: Neurology

## 2016-09-19 ENCOUNTER — Other Ambulatory Visit: Payer: Self-pay | Admitting: *Deleted

## 2016-09-22 ENCOUNTER — Encounter: Payer: Self-pay | Admitting: Neurology

## 2016-09-26 ENCOUNTER — Telehealth: Payer: Self-pay | Admitting: *Deleted

## 2016-09-26 NOTE — Telephone Encounter (Signed)
He has completed his repeat MRI scans at [facility].  1) MRI brain w/wo completed on 09/18/16:     Impression: mild periventricular subcortical white matter disease consistent with the      patient's diagnsosis of MS without  Evidence of contrast enhancement.  2) MRI thoracic w/wo completed on 09/22/16:      Impression: stable exam  3) MRI cervical w/wo completed on 09/22/16:      Impression: Relatively stable diffuse signal alteration throughout the cervical cord,      Consistent with his history on MS.  No enhancement or definite new focus.  Dr. Terrace Arabia reviewed the brain printed results prior to leaving for vacation.  [name] reviewed the thoracic and cervical printed results in her absence.  The patient has been informed of all results.  He has a pending follow up with Dr. Terrace Arabia in September.  He will bring the MRI disc with him to this appt for her review.

## 2016-12-06 ENCOUNTER — Ambulatory Visit: Payer: BLUE CROSS/BLUE SHIELD | Admitting: Neurology

## 2016-12-28 ENCOUNTER — Telehealth: Payer: Self-pay | Admitting: Neurology

## 2016-12-28 NOTE — Telephone Encounter (Signed)
Reviewed his last labs here and vitamin B was not included in the panel.  His wife is aware.

## 2016-12-28 NOTE — Telephone Encounter (Signed)
Pt's wife called wanted to know if Vit B is checked when labs are done. Pt is having problem with his leg and orthopaedic Dr was wanting to know.  Please call

## 2017-02-01 ENCOUNTER — Ambulatory Visit (INDEPENDENT_AMBULATORY_CARE_PROVIDER_SITE_OTHER): Payer: BLUE CROSS/BLUE SHIELD | Admitting: Neurology

## 2017-02-01 ENCOUNTER — Encounter: Payer: Self-pay | Admitting: Neurology

## 2017-02-01 ENCOUNTER — Encounter: Payer: Self-pay | Admitting: *Deleted

## 2017-02-01 VITALS — BP 149/86 | HR 54 | Ht 72.0 in | Wt 219.0 lb

## 2017-02-01 DIAGNOSIS — R269 Unspecified abnormalities of gait and mobility: Secondary | ICD-10-CM | POA: Diagnosis not present

## 2017-02-01 DIAGNOSIS — G35 Multiple sclerosis: Secondary | ICD-10-CM | POA: Diagnosis not present

## 2017-02-01 DIAGNOSIS — R252 Cramp and spasm: Secondary | ICD-10-CM | POA: Diagnosis not present

## 2017-02-01 MED ORDER — DULOXETINE HCL 60 MG PO CPEP: 60.0000 mg | ORAL_CAPSULE | Freq: Every day | ORAL | 11 refills | Status: DC

## 2017-02-01 MED ORDER — BACLOFEN 10 MG PO TABS: 10.0000 mg | ORAL_TABLET | Freq: Three times a day (TID) | ORAL | 3 refills | Status: DC

## 2017-02-01 NOTE — Progress Notes (Signed)
PATIENT: Timothy Love DOB: 1961-02-12  REASON FOR VISIT: follow up- multiple sclerosis HISTORY FROM: patient  HISTORY OF PRESENT ILLNESS:  HISTORY Timothy Love is a 56 years old right-handed Caucasian male, referred by his primary care physician Dr. Madelyn Flavors, and orthopedic surgeon Dr. 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 Clent Ridges, MRI of thoracic spine has demonstrate right upper thoracic spine, and the left lower thoracic spine T2 lesions, expending 1 to 2 segments  MRI of the brain with and without contrast in [facility] demonstrated right frontal T2 lesion, left coronal radiata lesions, there was one right parieto-occipital lobe with thin linear enhancing lesions.  He was not offered any treatment, overall he has much improved, now ambulates with mild unsteadiness, still has numbness at mid chest level, no incontinence, he also has occasional shooting numbness to his bilateral upper extremity, Lhermitte signs when he bends down his neck  I have reviewed the MRI film from [facility] diagnostic imaging in 2013, MRI cervical had patchy area of abnormality, at C2, 3, 4, 5, 6, T1-2.  CSF Nov 2013, more than 5 oligoclonal banding, WBC 9, above findings support definite diagnosis of relapsing remitting multiple sclerosis, he continued to have Lhermitt signs, ambulate without difficulty, no visual loss,   Visual evoked potential was abnormal, secondary to a prolonged P100 latency on the left. 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   UPDATE April 11 th 2014,  [name] was started in Jan 15th 2014, his insurance will not cover [name], overall he is doing well, he took the medicine Monday Wednesday Friday evening, proceeding with ibuprofen, he complains of mild worsening depression, he also has bilateral lower extremity increased tghtness. He still works full-time as Personnel officer, performing 95% off work capacity, avoiding high ladder climbing   UPDATE Feb 27th 2015: He was changed to [name] since Nov 2014, mild nause, flushing, after he had a repeat MRI brain and spine in Oct 2014, he had a second opinion at [facility], by [name] He is clinically stable, but on the repeat MRI of the brain, cervical, and thoracic spine, there was progression of disease, this was done in October 2014 at [facility]   MRI thoracic spine, Atrophic appearing thoracic cord with multiple T2 hyperintense lesions consistent with demyelinating plaques. No lesions demonstrate enhancement to indicate active demyelination.Compression deformity of the T10 vertebral body with edema and enhancement in the superior endplate, indicating that this is acute/subacute in nature. most notably at the T7-8 level appear larger than on the examination from 08/09/11. In addition, the T10 compression deformity described on the current study is new, and was not present on the 08/09/11 study  MRI cervical spine, the brainstem lesions described on the present examination from 01/03/13 appear new compared to the 02/19/12 study. Multiple lesions throughout the cervical cord appear overall unchanged. multiple T2 hyperintense plaques throughout  the cervical cord and proximal brainstem most notably the craniocervical junction and throughout the cervical cord extending from approximately C2-C6. No enhancement is  seen. There is mild multilevel uncovertebral and facet hypertrophy, but without significant canal or foraminal stenosis.  MRI of brain, Multiple T2 hyperintense lesions throughout the brainstem and cervical cord consistent with multiple sclerosis. No enhancement to suggest active demyelination.There is definitely a new lesion in the left periventricular white matter. There are also likely several new punctate lesions adjacent to the anterior horn of the cheeks and right lateral ventricle, however this may be exaggerated given differences in slice thickness between the 2 studies. The infratentorial lesions are not definitively seen on the prior study, however this may again be due to to technical factors.  He still works full times, at times, he complains of fatigue, legs gave up, hands does not cooperative, finger tips and feet paresthesia, gets tired, falling off ladders, loose balance, No significant bowel and bladder issues, urgerncy.  UPDATE May 8th 2015:  He had MRI at [place], [place], we have reviewed MRI thoracic spine, persistent signal alteration within the cord, in a similar distribution as the prior examination. The overall size of the cord however is not as prominent suggesting possible mild improvement. No new signal alteration is evident, and there is no  abnormal region of enhancement on the postcontrast portion of the examination. Minimal degenerative disc disease without significant spinal or  foraminal stenosis at any level.   MRI cervical spine: With and without contrast, persistent signal alteration within the cervical cord, in a distribution similar to previous examination, the overall size of the cortex is not as prominent, suggesting possible mild improvement, no new abnormal signals, no contrast enhancement.  MRI of the brain without contrast report, that he was stable white matter disease, in comparison to August 2013, the distribution of consistent with multiple sclerosis, no  contrast enhancement, 5 mm enhancing focus along the undersurface of right frontal lobe, suggesting a tiny meningioma, next lef  clinically patient is stable as well, his fatigue has much improved with ranitidine 5 mg twice a day, no significant gait difficulty, but occasionally bilateral lower extremity muscle spasm, jumpy movement, no visual change   UPDATE Dec 4th 2015: He is dong well, a lot better than 2014, he does not have those frequent episode of left jerking, ritalin 5 mg twice a day he has been very helpful, he was able to work full time  He has mild flushing with [name] treatment.  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  MRI of brain: there is no restricted diffusion to suggest an acute cortical or white matter infarct. The scattered foci of high signal within the subcortical and periventricular white matter are stable. There is no new focus of abnormal signal. There is no significant atrophy, or ventriculomegaly.  muscle spasticity. Overall he feels that things have remained stable. He returns today for an evaluation.  Update February 12th 2018: He has no clear significant clinical flareup, but over the past few years, he  has gradual worsening spastic gait, especially the right leg, he tends to drag his right leg, also mild right and arm weakness and numbness  We have personally reviewed MRI with and without contrast in 2017, MRI of the brain mild supratentorium lesions contrast enhancement MRI of  cervical and thoracic spine: Patchy area of cord hyperintensity, no contrast enhancement MRI of lumbar: Multilevel degenerative disc disease, no canal stenosis, mild foraminal stenosis at different levels  EMG nerve conduction study in February 2018 showed no significant abnormality, no evidence of lumbar stenosis.  He complains of fatigue, taking Ritalin 5 mg twice a day, bilateral lower extremity spasticity taking baclofen 10 mg 3 times a day, gabapentin 300 mg 2 tablets 3 times a day  Has been on [name] treatment, tolerating it well.  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  UPDATE September 12 2016: He continue complains of mild gait abnormality, dragging his right leg, difficulty climbing up steps, he is hesitate to start more aggressive treatment for his Relapsing Remitting Multiple Sclerosis, concern of potential side effect, we went over the potential side effect of Ocrelizumab in detail  Extensive laboratory evaluations in March 2018, showed negative QuantiFERON antibody, hepatitis B surface antigen, positive VZV IgG antibody, normal TSH, CMP,  UPDATE Feb 01 2017: He noticed more gait abnormality, unsteady, right leg is getting weaker, he has radiating pain to right leg, he contributed his right anterior thigh paresthesia from his previous right hernia surgery. He also has chronic low back pain, MRI Lumbar showed degenerative changes. EMG nerve conduction in February 2018 was essentially normal  MRI of the brain in June 2018 showed mild periventricular subcortical white matter disease, no contrast enhancement. MRI of cervical spine and thoracic spine with without contrast showed diffuse signal throughout the cervical cord, and scattered subtle foci of high signal intensity within the thoracic spine, no contrast enhancement,  He also complains of intermittent episode of urinary and bowel incontinence,   REVIEW OF SYSTEMS: Out of a complete 14 system review of symptoms, the  patient complains only of the following symptoms, and all other reviewed systems are negative.  Fatigue, eye pain, murmur, incontinence of bowels, bladder, back pain, walking difficulty, dizziness, numbness, weakness  ALLERGIES: Allergies  Allergen Reactions  . Penicillins Other (See Comments)    Child-doesn't know    HOME MEDICATIONS: Outpatient Medications Prior to Visit  Medication Sig Dispense Refill  . baclofen (LIORESAL) 10 MG tablet Take 1 tablet (10 mg total) by mouth 3 (three) times daily. 270 each 3  . Cholecalciferol (VITAMIN D PO) Take 2,000 Units by mouth daily.     . Dimethyl Fumarate ([name]) 240 MG CPDR Take 1 capsule (240 mg total) by mouth 2 (two) times daily. 180 capsule 3  . FLUoxetine (PROZAC) 40 MG capsule 40 mg.    . gabapentin (NEURONTIN) 300 MG capsule TAKE 2 CAPSULES THREE TIMES A DAY 540 capsule 3  . methylphenidate (RITALIN) 5 MG tablet Take 1 tablet (5 mg total) by mouth 2 (two) times daily. 60 tablet 0  . pravastatin (PRAVACHOL) 10 MG tablet Take 10 mg by mouth daily.     No facility-administered medications prior to visit.     PAST MEDICAL HISTORY: Past Medical History:  Diagnosis Date  . Anxiety   . Depression   . Kidney stone   . MS (multiple sclerosis) (HCC)   . Unspecified cause of encephalitis, myelitis, and encephalomyelitis     PAST SURGICAL HISTORY: No past surgical  history on file.  FAMILY HISTORY: Family History  Problem Relation Age of Onset  . High Cholesterol Mother   . Heart Problems Mother   . Heart attack Father     SOCIAL HISTORY: Social History   Social History  . Marital status: Married    Spouse name: cynthia  . Number of children: 2  . Years of education: college   Occupational History  .  [employer]   Social History Main Topics  . Smoking status: Never Smoker  . Smokeless tobacco: Never Used  . Alcohol use No  . Drug use: No  . Sexual activity: Not on file   Other Topics Concern  .  Not on file   Social History Narrative  . No narrative on file      PHYSICAL EXAM  Vitals:   02/01/17 0721  BP: (!) 149/86  Pulse: (!) 54  Weight: 219 lb (99.3 kg)  Height: 6' (1.829 m)   Body mass index is 29.7 kg/m.   PHYSICAL EXAMNIATION:  Gen: NAD, conversant, well nourised, obese, well groomed                     Cardiovascular: Regular rate rhythm, no peripheral edema, warm, nontender. Eyes: Conjunctivae clear without exudates or hemorrhage Neck: Supple, no carotid bruits. Pulmonary: Clear to auscultation bilaterally   NEUROLOGICAL EXAM:  MENTAL STATUS: Speech:    Speech is normal; fluent and spontaneous with normal comprehension.  Cognition:     Orientation to time, place and person     Normal recent and remote memory     Normal Attention span and concentration     Normal Language, naming, repeating,spontaneous speech     Fund of knowledge   CRANIAL NERVES: CN II: Visual fields are full to confrontation. Fundoscopic exam is normal with sharp discs and no vascular changes. Pupils are round equal and briskly reactive to light. CN III, IV, VI: extraocular movement are normal. No ptosis. CN V: Facial sensation is intact to pinprick in all 3 divisions bilaterally. Corneal responses are intact.  CN VII: Face is symmetric with normal eye closure and smile. CN VIII: Hearing is normal to rubbing fingers CN IX, X: Palate elevates symmetrically. Phonation is normal. CN XI: Head turning and shoulder shrug are intact CN XII: Tongue is midline with normal movements and no atrophy.  MOTOR: He has mild spasticity of bilateral lower extremity, He has mild bilateral shoulder abduction, external rotation weakness, right worse than left, he has mild bilateral lower extremity spasticity, right worse than left, moderate right hip flexion right ankle dorsiflexion weakness.   REFLEXES: Reflexes are 3 and symmetric at the biceps, triceps, knees, and ankles. Plantar responses are  extensor bilaterally.  SENSORY: Intact to light touch, pinprick, positional and vibratory sensation are intact in fingers and toes.  COORDINATION: Rapid alternating movements and fine finger movements are intact. There is no dysmetria on finger-to-nose and heel-knee-shin.    GAIT/STANCE: Right leg hemi-circumferential gait, dragging his right leg, mildly unsteady,    DIAGNOSTIC DATA (LABS, IMAGING, TESTING) - I reviewed patient records, labs, notes, testing and imaging myself where available.  Lab Results  Component Value Date   WBC 4.9 06/12/2016   HGB 14.8 06/12/2016   HCT 45.2 06/12/2016   MCV 88 06/12/2016   PLT 218 06/12/2016      Component Value Date/Time   NA 141 06/12/2016 1022   K 4.9 06/12/2016 1022   CL 100 06/12/2016 1022  CO2 24 06/12/2016 1022   GLUCOSE 84 06/12/2016 1022   BUN 31 (H) 06/12/2016 1022   CREATININE 0.83 06/12/2016 1022   CALCIUM 9.8 06/12/2016 1022   PROT 7.1 06/12/2016 1022   ALBUMIN 4.8 06/12/2016 1022   AST 17 06/12/2016 1022   ALT 22 06/12/2016 1022   ALKPHOS 76 06/12/2016 1022   BILITOT 0.7 06/12/2016 1022   GFRNONAA 98 06/12/2016 1022   GFRAA 114 06/12/2016 1022     ASSESSMENT AND PLAN 56 y.o. year old male   Relapsing remitting multiple sclerosis  He has slow decline of functional status with increased fatigue, gait abnormality, right leg weaknes despite [name] treatment.  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

## 2017-02-02 ENCOUNTER — Telehealth: Payer: Self-pay | Admitting: Neurology

## 2017-02-02 LAB — COMPREHENSIVE METABOLIC PANEL
ALBUMIN: 4.6 g/dL (ref 3.5–5.5)
ALK PHOS: 74 IU/L (ref 39–117)
ALT: 14 IU/L (ref 0–44)
AST: 15 IU/L (ref 0–40)
Albumin/Globulin Ratio: 2.2 (ref 1.2–2.2)
BUN / CREAT RATIO: 27 — AB (ref 9–20)
BUN: 22 mg/dL (ref 6–24)
Bilirubin Total: 0.7 mg/dL (ref 0.0–1.2)
CO2: 24 mmol/L (ref 20–29)
CREATININE: 0.81 mg/dL (ref 0.76–1.27)
Calcium: 9.8 mg/dL (ref 8.7–10.2)
Chloride: 104 mmol/L (ref 96–106)
GFR calc non Af Amer: 99 mL/min/{1.73_m2} (ref >59)
GFR, EST AFRICAN AMERICAN: 115 mL/min/{1.73_m2} (ref >59)
GLOBULIN, TOTAL: 2.1 g/dL (ref 1.5–4.5)
GLUCOSE: 83 mg/dL (ref 65–99)
Potassium: 4.5 mmol/L (ref 3.5–5.2)
SODIUM: 144 mmol/L (ref 134–144)
TOTAL PROTEIN: 6.7 g/dL (ref 6.0–8.5)

## 2017-02-02 LAB — CBC WITH DIFFERENTIAL
BASOS ABS: 0 10*3/uL (ref 0.0–0.2)
Basos: 1 %
EOS (ABSOLUTE): 0.1 10*3/uL (ref 0.0–0.4)
EOS: 2 %
HEMATOCRIT: 43.5 % (ref 37.5–51.0)
HEMOGLOBIN: 14.5 g/dL (ref 13.0–17.7)
IMMATURE GRANS (ABS): 0 10*3/uL (ref 0.0–0.1)
IMMATURE GRANULOCYTES: 0 %
LYMPHS ABS: 0.4 10*3/uL — AB (ref 0.7–3.1)
LYMPHS: 10 %
MCH: 30 pg (ref 26.6–33.0)
MCHC: 33.3 g/dL (ref 31.5–35.7)
MCV: 90 fL (ref 79–97)
MONOCYTES: 10 %
Monocytes Absolute: 0.5 10*3/uL (ref 0.1–0.9)
NEUTROS ABS: 3.4 10*3/uL (ref 1.4–7.0)
Neutrophils: 77 %
RBC: 4.84 x10E6/uL (ref 4.14–5.80)
RDW: 13.6 % (ref 12.3–15.4)
WBC: 4.4 10*3/uL (ref 3.4–10.8)

## 2017-02-02 LAB — HEPATITIS B CORE ANTIBODY, TOTAL: Hep B Core Total Ab: NEGATIVE

## 2017-02-02 LAB — VITAMIN B12: Vitamin B-12: 201 pg/mL — ABNORMAL LOW (ref 232–1245)

## 2017-02-02 LAB — TSH: TSH: 1.55 u[IU]/mL (ref 0.450–4.500)

## 2017-02-02 LAB — HEPATITIS B SURFACE ANTIBODY,QUALITATIVE: Hep B Surface Ab, Qual: NONREACTIVE

## 2017-02-02 LAB — HEPATITIS B SURFACE ANTIGEN: HEP B S AG: NEGATIVE

## 2017-02-02 LAB — VARICELLA ZOSTER ANTIBODY, IGG: Varicella zoster IgG: 1888 index (ref >165)

## 2017-02-02 NOTE — Telephone Encounter (Signed)
Please call patient, laboratory evaluation showed decreased vitamin B12 level 201, he will benefit vitamin B12 intramuscular supplement, 1000 g daily for one week, weekly for one month, then monthly basis.  It is best for him to have repeat vitamin B12 level, methylmalonic acid level, homocystine, folic acid level through his primary care's office before supplement.

## 2017-02-05 LAB — QUANTIFERON-TB GOLD PLUS
QUANTIFERON MITOGEN VALUE: 0.14 [IU]/mL
QUANTIFERON TB1 AG VALUE: 0.02 [IU]/mL
QUANTIFERON TB2 AG VALUE: 0.02 [IU]/mL
QUANTIFERON-TB GOLD PLUS: UNDETERMINED — AB
QuantiFERON Nil Value: 0.02 IU/mL

## 2017-02-05 NOTE — Telephone Encounter (Signed)
Spoke to patient - he is aware of results and agreeable to the plan below.  Due to his location, he will follow up with PCP for repeat labs and injections.  He is aware the information will be faxed to Richarda Blade, NP's office today.

## 2017-02-12 ENCOUNTER — Telehealth: Payer: Self-pay | Admitting: Neurology

## 2017-02-12 NOTE — Telephone Encounter (Signed)
States his PCP office is requesting copy of his lab work and B12 recommendations.  This was faxed and confirmed to them on 02/05/17.  Re-faxed information today to his PCP.  Received confirmation of receipt.

## 2017-02-12 NOTE — Telephone Encounter (Signed)
Patient calling too discuss  B-12 shots.

## 2017-02-19 NOTE — Telephone Encounter (Signed)
Baxter HireKristen G/NP with [facility] called said the pt's labs are normal. Does provider still want B12 injections given. Please call her at [phone]

## 2017-02-19 NOTE — Telephone Encounter (Signed)
Returned call to Dellia Beckwith., NP - patient's repeat B12 lab continued to show a decreased level of 203.  Per vo by Dr. Terrace Arabia, she would like for him to proceed with B12 injections on the recommended schedule below.  Kristen verbalized understanding and will get him in to start treatment for his deficiency.

## 2017-04-24 ENCOUNTER — Encounter: Payer: Self-pay | Admitting: *Deleted

## 2017-05-08 ENCOUNTER — Encounter: Payer: Self-pay | Admitting: Neurology

## 2017-05-08 ENCOUNTER — Ambulatory Visit (INDEPENDENT_AMBULATORY_CARE_PROVIDER_SITE_OTHER): Payer: BLUE CROSS/BLUE SHIELD | Admitting: Neurology

## 2017-05-08 VITALS — BP 153/84 | HR 56 | Ht 72.0 in | Wt 228.0 lb

## 2017-05-08 DIAGNOSIS — R252 Cramp and spasm: Secondary | ICD-10-CM

## 2017-05-08 DIAGNOSIS — G35 Multiple sclerosis: Secondary | ICD-10-CM | POA: Diagnosis not present

## 2017-05-08 NOTE — Progress Notes (Signed)
PATIENT: Timothy Love DOB: 1961-02-12  REASON FOR VISIT: follow up- multiple sclerosis HISTORY FROM: patient  HISTORY OF PRESENT ILLNESS:  HISTORY Timothy Love is a 57 years old right-handed Caucasian male, referred by his primary care physician Dr. Madelyn Flavors, and orthopedic surgeon Dr. 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 Clent Ridges, MRI of thoracic spine has demonstrate right upper thoracic spine, and the left lower thoracic spine T2 lesions, expending 1 to 2 segments  MRI of the brain with and without contrast in [facility] demonstrated right frontal T2 lesion, left coronal radiata lesions, there was one right parieto-occipital lobe with thin linear enhancing lesions.  He was not offered any treatment, overall he has much improved, now ambulates with mild unsteadiness, still has numbness at mid chest level, no incontinence, he also has occasional shooting numbness to his bilateral upper extremity, Lhermitte signs when he bends down his neck  I have reviewed the MRI film from [facility] diagnostic imaging in 2013, MRI cervical had patchy area of abnormality, at C2, 3, 4, 5, 6, T1-2.  CSF Nov 2013, more than 5 oligoclonal banding, WBC 9, above findings support definite diagnosis of relapsing remitting multiple sclerosis, he continued to have Lhermitt signs, ambulate without difficulty, no visual loss,   Visual evoked potential was abnormal, secondary to a prolonged P100 latency on the left. 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   UPDATE April 11 th 2014,  [name] was started in Jan 15th 2014, his insurance will not cover [name], overall he is doing well, he took the medicine Monday Wednesday Friday evening, proceeding with ibuprofen, he complains of mild worsening depression, he also has bilateral lower extremity increased tghtness. He still works full-time as Personnel officer, performing 95% off work capacity, avoiding high ladder climbing   UPDATE Feb 27th 2015: He was changed to [name] since Nov 2014, mild nause, flushing, after he had a repeat MRI brain and spine in Oct 2014, he had a second opinion at [facility], by [name] He is clinically stable, but on the repeat MRI of the brain, cervical, and thoracic spine, there was progression of disease, this was done in October 2014 at [facility]   MRI thoracic spine, Atrophic appearing thoracic cord with multiple T2 hyperintense lesions consistent with demyelinating plaques. No lesions demonstrate enhancement to indicate active demyelination.Compression deformity of the T10 vertebral body with edema and enhancement in the superior endplate, indicating that this is acute/subacute in nature. most notably at the T7-8 level appear larger than on the examination from 08/09/11. In addition, the T10 compression deformity described on the current study is new, and was not present on the 08/09/11 study  MRI cervical spine, the brainstem lesions described on the present examination from 01/03/13 appear new compared to the 02/19/12 study. Multiple lesions throughout the cervical cord appear overall unchanged. multiple T2 hyperintense plaques throughout  the cervical cord and proximal brainstem most notably the craniocervical junction and throughout the cervical cord extending from approximately C2-C6. No enhancement is  seen. There is mild multilevel uncovertebral and facet hypertrophy, but without significant canal or foraminal stenosis.  MRI of brain, Multiple T2 hyperintense lesions throughout the brainstem and cervical cord consistent with multiple sclerosis. No enhancement to suggest active demyelination.There is definitely a new lesion in the left periventricular white matter. There are also likely several new punctate lesions adjacent to the anterior horn of the cheeks and right lateral ventricle, however this may be exaggerated given differences in slice thickness between the 2 studies. The infratentorial lesions are not definitively seen on the prior study, however this may again be due to to technical factors.  He still works full times, at times, he complains of fatigue, legs gave up, hands does not cooperative, finger tips and feet paresthesia, gets tired, falling off ladders, loose balance, No significant bowel and bladder issues, urgerncy.  UPDATE May 8th 2015:  He had MRI at [place], [place], we have reviewed MRI thoracic spine, persistent signal alteration within the cord, in a similar distribution as the prior examination. The overall size of the cord however is not as prominent suggesting possible mild improvement. No new signal alteration is evident, and there is no  abnormal region of enhancement on the postcontrast portion of the examination. Minimal degenerative disc disease without significant spinal or  foraminal stenosis at any level.   MRI cervical spine: With and without contrast, persistent signal alteration within the cervical cord, in a distribution similar to previous examination, the overall size of the cortex is not as prominent, suggesting possible mild improvement, no new abnormal signals, no contrast enhancement.  MRI of the brain without contrast report, that he was stable white matter disease, in comparison to August 2013, the distribution of consistent with multiple sclerosis, no  contrast enhancement, 5 mm enhancing focus along the undersurface of right frontal lobe, suggesting a tiny meningioma, next lef  clinically patient is stable as well, his fatigue has much improved with ranitidine 5 mg twice a day, no significant gait difficulty, but occasionally bilateral lower extremity muscle spasm, jumpy movement, no visual change   UPDATE Dec 4th 2015: He is dong well, a lot better than 2014, he does not have those frequent episode of left jerking, ritalin 5 mg twice a day he has been very helpful, he was able to work full time  He has mild flushing with [name] treatment.  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  MRI of brain: there is no restricted diffusion to suggest an acute cortical or white matter infarct. The scattered foci of high signal within the subcortical and periventricular white matter are stable. There is no new focus of abnormal signal. There is no significant atrophy, or ventriculomegaly.  muscle spasticity. Overall he feels that things have remained stable. He returns today for an evaluation.  Update February 12th 2018: He has no clear significant clinical flareup, but over the past few years, he  has gradual worsening spastic gait, especially the right leg, he tends to drag his right leg, also mild right and arm weakness and numbness  We have personally reviewed MRI with and without contrast in 2017, MRI of the brain mild supratentorium lesions contrast enhancement MRI of  cervical and thoracic spine: Patchy area of cord hyperintensity, no contrast enhancement MRI of lumbar: Multilevel degenerative disc disease, no canal stenosis, mild foraminal stenosis at different levels  EMG nerve conduction study in February 2018 showed no significant abnormality, no evidence of lumbar stenosis.  He complains of fatigue, taking Ritalin 5 mg twice a day, bilateral lower extremity spasticity taking baclofen 10 mg 3 times a day, gabapentin 300 mg 2 tablets 3 times a day  Has been on [name] treatment, tolerating it well.  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  UPDATE September 12 2016: He continue complains of mild gait abnormality, dragging his right leg, difficulty climbing up steps, he is hesitate to start more aggressive treatment for his Relapsing Remitting Multiple Sclerosis, concern of potential side effect, we went over the potential side effect of Ocrelizumab in detail  Extensive laboratory evaluations in March 2018, showed negative QuantiFERON antibody, hepatitis B surface antigen, positive VZV IgG antibody, normal TSH, CMP,  UPDATE Feb 01 2017: He noticed more gait abnormality, unsteady, right leg is getting weaker, he has radiating pain to right leg, he contributed his right anterior thigh paresthesia from his previous right hernia surgery. He also has chronic low back pain, MRI Lumbar showed degenerative changes. EMG nerve conduction in February 2018 was essentially normal  MRI of the brain in June 2018 showed mild periventricular subcortical white matter disease, no contrast enhancement. MRI of cervical spine and thoracic spine with without contrast showed diffuse signal throughout the cervical cord, and scattered subtle foci of high signal intensity within the thoracic spine, no contrast enhancement,  He also complains of intermittent episode of urinary and bowel incontinence,  UPDATE May 08 2017: He got ocrevus infusion Dec 3, and Dec 18th, following 1st  infusion, he had a transient diffuse body achy pain, but did very well with his second infusion. He has mild weakness of right leg, right hip bursitis, right hernia surgery in the past, noticed some irritation of right groin area, shooting discomfort to right leg.  He also complains of  right leg jerking movement.  He fell in Jan 2019, due to right leg weakness.  Gradual worsening increased gait abnormality  REVIEW OF SYSTEMS: Out of a complete 14 system review of symptoms, the patient complains only of the following symptoms, and all other reviewed systems are negative.  Fatigue, eye pain, murmur, incontinence of bowels, bladder, back pain, walking difficulty, dizziness, numbness, weakness  ALLERGIES: Allergies  Allergen Reactions  . Penicillins Other (See Comments)    Child-doesn't know    HOME MEDICATIONS: Outpatient Medications Prior to Visit  Medication Sig Dispense Refill  . baclofen (LIORESAL) 10 MG tablet Take 1 tablet (10 mg total) by mouth 3 (three) times daily. 270 each 3  . Cholecalciferol (VITAMIN D PO) Take 2,000 Units by mouth daily.     Marland Kitchen FLUoxetine (PROZAC) 40 MG capsule 40 mg.    . gabapentin (NEURONTIN) 300 MG capsule TAKE 2 CAPSULES THREE TIMES A DAY 540 capsule 3  . methylphenidate (RITALIN) 5 MG tablet Take 1 tablet (5 mg total) by mouth 2 (two) times daily. 60 tablet 0  . ocrelizumab 600 mg in sodium chloride 0.9 % 500  mL Inject 600 mg into the vein every 6 (six) months. Initial infusions: 03/05/17, 03/20/17    . pravastatin (PRAVACHOL) 10 MG tablet Take 10 mg by mouth daily.    . DULoxetine (CYMBALTA) 60 MG capsule Take 1 capsule (60 mg total) by mouth daily. 30 capsule 11   No facility-administered medications prior to visit.     PAST MEDICAL HISTORY: Past Medical History:  Diagnosis Date  . Anxiety   . Depression   . Kidney stone   . MS (multiple sclerosis) (HCC)   . Unspecified cause of encephalitis, myelitis, and encephalomyelitis     PAST SURGICAL  HISTORY: History reviewed. No pertinent surgical history.  FAMILY HISTORY: Family History  Problem Relation Age of Onset  . High Cholesterol Mother   . Heart Problems Mother   . Heart attack Father     SOCIAL HISTORY: Social History   Socioeconomic History  . Marital status: Married    Spouse name: cynthia  . Number of children: 2  . Years of education: college  . Highest education level: Not on file  Social Needs  . Financial resource strain: Not on file  . Food insecurity - worry: Not on file  . Food insecurity - inability: Not on file  . Transportation needs - medical: Not on file  . Transportation needs - non-medical: Not on file  Occupational History    Employer: [employer]  Tobacco Use  . Smoking status: Never Smoker  . Smokeless tobacco: Never Used  Substance and Sexual Activity  . Alcohol use: No    Alcohol/week: 0.0 oz  . Drug use: No  . Sexual activity: Not on file  Other Topics Concern  . Not on file  Social History Narrative  . Not on file      PHYSICAL EXAM  Vitals:   05/08/17 0727  BP: (!) 153/84  Pulse: (!) 56  Weight: 228 lb (103.4 kg)  Height: 6' (1.829 m)   Body mass index is 30.92 kg/m.   PHYSICAL EXAMNIATION:  Gen: NAD, conversant, well nourised, obese, well groomed                     Cardiovascular: Regular rate rhythm, no peripheral edema, warm, nontender. Eyes: Conjunctivae clear without exudates or hemorrhage Neck: Supple, no carotid bruits. Pulmonary: Clear to auscultation bilaterally   NEUROLOGICAL EXAM:  MENTAL STATUS: Speech:    Speech is normal; fluent and spontaneous with normal comprehension.  Cognition:     Orientation to time, place and person     Normal recent and remote memory     Normal Attention span and concentration     Normal Language, naming, repeating,spontaneous speech     Fund of knowledge   CRANIAL NERVES: CN II: Visual fields are full to confrontation. Fundoscopic exam is normal  with sharp discs and no vascular changes. Pupils are round equal and briskly reactive to light. CN III, IV, VI: extraocular movement are normal. No ptosis. CN V: Facial sensation is intact to pinprick in all 3 divisions bilaterally. Corneal responses are intact.  CN VII: Face is symmetric with normal eye closure and smile. CN VIII: Hearing is normal to rubbing fingers CN IX, X: Palate elevates symmetrically. Phonation is normal. CN XI: Head turning and shoulder shrug are intact CN XII: Tongue is midline with normal movements and no atrophy.  MOTOR: He has mild spasticity of bilateral lower extremity, He has mild bilateral shoulder abduction, external rotation weakness, right worse  than left, he has mild bilateral lower extremity spasticity, right worse than left, moderate right hip flexion right ankle dorsiflexion weakness.   REFLEXES: Reflexes are 3 and symmetric at the biceps, triceps, knees, and ankles. Plantar responses are extensor bilaterally.  SENSORY: Intact to light touch, pinprick, positional and vibratory sensation are intact in fingers and toes.  COORDINATION: Rapid alternating movements and fine finger movements are intact. There is no dysmetria on finger-to-nose and heel-knee-shin.    GAIT/STANCE: Right leg hemi-circumferential gait, dragging his right leg, mildly unsteady,    DIAGNOSTIC DATA (LABS, IMAGING, TESTING) - I reviewed patient records, labs, notes, testing and imaging myself where available.  Lab Results  Component Value Date   WBC 4.4 02/01/2017   HGB 14.5 02/01/2017   HCT 43.5 02/01/2017   MCV 90 02/01/2017   PLT 218 06/12/2016      Component Value Date/Time   NA 144 02/01/2017 0822   K 4.5 02/01/2017 0822   CL 104 02/01/2017 0822   CO2 24 02/01/2017 0822   GLUCOSE 83 02/01/2017 0822   BUN 22 02/01/2017 0822   CREATININE 0.81 02/01/2017 0822   CALCIUM 9.8 02/01/2017 0822   PROT 6.7 02/01/2017 0822   ALBUMIN 4.6 02/01/2017 0822   AST 15  02/01/2017 0822   ALT 14 02/01/2017 0822   ALKPHOS 74 02/01/2017 0822   BILITOT 0.7 02/01/2017 0822   GFRNONAA 99 02/01/2017 0822   GFRAA 115 02/01/2017 0822     ASSESSMENT AND PLAN 57 y.o. year old male   Relapsing remitting multiple sclerosis  He has slow decline of functional status with increased fatigue, gait abnormality, right leg weaknes despite [name] treatment.  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   Spasticity, gait abnormality  Baclofen 10 mg 3 times a day; gabapentin 300 mg 2 tablets 3 times a day,    Keep Cymbalta 60mg  daily  Emg guided xeomin injection.  Ask for 500 units, will use 300 units for first injection  Levert Feinstein, M.D. Ph.D.  [facility] [address] [place], [place] [address] Phone: [phone] Fax:      [phone]

## 2017-06-15 ENCOUNTER — Telehealth: Payer: Self-pay | Admitting: Neurology

## 2017-06-15 NOTE — Telephone Encounter (Signed)
Pt has called stating he is returning the call to [facility]. Please call

## 2017-06-18 NOTE — Telephone Encounter (Signed)
Late entry;  I spoke with the patient regarding his injections. He will have an extremely high copay so he is going to hold off on injections until he gets copay assistance.

## 2017-06-19 ENCOUNTER — Ambulatory Visit: Payer: BLUE CROSS/BLUE SHIELD | Admitting: Neurology

## 2017-06-19 NOTE — Telephone Encounter (Signed)
Pt has called asking for Danielle to call him back

## 2017-06-20 NOTE — Telephone Encounter (Signed)
I returned the patients call but he did not answer. I left a VM asking him to call me back.

## 2017-07-09 NOTE — Telephone Encounter (Signed)
Pt called he rec'd confirmation he was approved for copay assistance. Please call to advise further.

## 2017-07-10 NOTE — Telephone Encounter (Signed)
I called and spoke with the patient and scheduled his apt.

## 2017-07-12 ENCOUNTER — Telehealth: Payer: Self-pay | Admitting: Neurology

## 2017-07-12 NOTE — Telephone Encounter (Signed)
Tyler @ [facility] called to inform that per patients insurance this medication has to be filed under [employer] so they are changing to file properly. Please call [phone] option 3

## 2017-07-12 NOTE — Telephone Encounter (Addendum)
I called the medication into the patients SP, [facility]. Patient's recommended max was for 400 units. This is what the rx was called in for.   The number for the patient to call [phone]

## 2017-07-12 NOTE — Telephone Encounter (Signed)
I called the patient to make him aware and give him the phone number to SP.

## 2017-07-12 NOTE — Telephone Encounter (Signed)
aware

## 2017-07-16 NOTE — Telephone Encounter (Signed)
Patient called and stated that [facility] would not fill his medication and it must go through [facility]. Paperwork started for [facility].

## 2017-07-23 NOTE — Telephone Encounter (Signed)
I called to check status of the patient medication at [facility], it is still pending.

## 2017-08-22 ENCOUNTER — Other Ambulatory Visit: Payer: Self-pay | Admitting: Neurology

## 2017-08-29 ENCOUNTER — Telehealth: Payer: Self-pay | Admitting: Neurology

## 2017-08-29 NOTE — Telephone Encounter (Signed)
[phone] (09/04/17-08/05/2018) 4 visits

## 2017-09-04 ENCOUNTER — Ambulatory Visit (INDEPENDENT_AMBULATORY_CARE_PROVIDER_SITE_OTHER): Payer: BLUE CROSS/BLUE SHIELD | Admitting: Neurology

## 2017-09-04 ENCOUNTER — Encounter

## 2017-09-04 ENCOUNTER — Encounter: Payer: Self-pay | Admitting: Neurology

## 2017-09-04 ENCOUNTER — Encounter: Payer: Self-pay | Admitting: *Deleted

## 2017-09-04 VITALS — BP 163/92 | HR 50 | Ht 72.0 in | Wt 216.0 lb

## 2017-09-04 DIAGNOSIS — G35 Multiple sclerosis: Secondary | ICD-10-CM

## 2017-09-04 DIAGNOSIS — G8311 Monoplegia of lower limb affecting right dominant side: Secondary | ICD-10-CM | POA: Diagnosis not present

## 2017-09-04 MED ORDER — INCOBOTULINUMTOXINA 100 UNITS IM SOLR
300.0000 [IU] | INTRAMUSCULAR | Status: DC
  Administered 2017-09-04: 300 [IU] via INTRAMUSCULAR

## 2017-09-04 NOTE — Progress Notes (Signed)
**  Xeomin 100 units x 3 vials, NDC 0259-1610-01, Lot [id number], Exp 11/2019, office supply.//mck,rn**

## 2017-09-04 NOTE — Progress Notes (Signed)
PATIENT: Timothy Love DOB: January 27, 1961  REASON FOR VISIT: follow up- multiple sclerosis HISTORY FROM: patient  HISTORY OF PRESENT ILLNESS:  HISTORY Timothy Love is a 57 years old right-handed Caucasian male, referred by his primary care physician Dr. Madelyn Flavors, and orthopedic surgeon Dr. 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 Clent Ridges, MRI of thoracic spine has demonstrate right upper thoracic spine, and the left lower thoracic spine T2 lesions, expending 1 to 2 segments  MRI of the brain with and without contrast in [facility] demonstrated right frontal T2 lesion, left coronal radiata lesions, there was one right parieto-occipital lobe with thin linear enhancing lesions.  He was not offered any treatment, overall he has much improved, now ambulates with mild unsteadiness, still has numbness at mid chest level, no incontinence, he also has occasional shooting numbness to his bilateral upper extremity, Lhermitte signs when he bends down his neck  I have reviewed the MRI film from [facility] diagnostic imaging in 2013, MRI cervical had patchy area of abnormality, at C2, 3, 4, 5, 6, T1-2.  CSF Nov 2013, more than 5 oligoclonal banding, WBC 9, above findings support definite diagnosis of relapsing remitting multiple sclerosis, he continued to have Lhermitt signs, ambulate without difficulty, no visual loss,   Visual evoked potential was abnormal, secondary to a prolonged P100 latency on the left. 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   UPDATE April 11 th 2014,  [name] was started in Jan 15th 2014, his insurance will not cover [name], overall he is doing well, he took the medicine Monday Wednesday Friday evening, proceeding with ibuprofen, he complains of mild worsening depression, he also has bilateral lower extremity increased tghtness. He still works full-time as Personnel officer, performing 95% off work capacity, avoiding high ladder climbing   UPDATE Feb 27th 2015: He was changed to [name] since Nov 2014, mild nause, flushing, after he had a repeat MRI brain and spine in Oct 2014, he had a second opinion at [facility], by [name] He is clinically stable, but on the repeat MRI of the brain, cervical, and thoracic spine, there was progression of disease, this was done in October 2014 at [facility]   MRI thoracic spine, Atrophic appearing thoracic cord with multiple T2 hyperintense lesions consistent with demyelinating plaques. No lesions demonstrate enhancement to indicate active demyelination.Compression deformity of the T10 vertebral body with edema and enhancement in the superior endplate, indicating that this is acute/subacute in nature. most notably at the T7-8 level appear larger than on the examination from 08/09/11. In addition, the T10 compression deformity described on the current study is new, and was not present on the 08/09/11 study  MRI cervical spine, the brainstem lesions described on the present examination from 01/03/13 appear new compared to the 02/19/12 study. Multiple lesions throughout the cervical cord appear overall unchanged. multiple T2 hyperintense plaques throughout  the cervical cord and proximal brainstem most notably the craniocervical junction and throughout the cervical cord extending from approximately C2-C6. No enhancement is  seen. There is mild multilevel uncovertebral and facet hypertrophy, but without significant canal or foraminal stenosis.  MRI of brain, Multiple T2 hyperintense lesions throughout the brainstem and cervical cord consistent with multiple sclerosis. No enhancement to suggest active demyelination.There is definitely a new lesion in the left periventricular white matter. There are also likely several new punctate lesions adjacent to the anterior horn of the cheeks and right lateral ventricle, however this may be exaggerated given differences in slice thickness between the 2 studies. The infratentorial lesions are not definitively seen on the prior study, however this may again be due to to technical factors.  He still works full times, at times, he complains of fatigue, legs gave up, hands does not cooperative, finger tips and feet paresthesia, gets tired, falling off ladders, loose balance, No significant bowel and bladder issues, urgerncy.  UPDATE May 8th 2015:  He had MRI at [place], [place], we have reviewed MRI thoracic spine, persistent signal alteration within the cord, in a similar distribution as the prior examination. The overall size of the cord however is not as prominent suggesting possible mild improvement. No new signal alteration is evident, and there is no  abnormal region of enhancement on the postcontrast portion of the examination. Minimal degenerative disc disease without significant spinal or  foraminal stenosis at any level.   MRI cervical spine: With and without contrast, persistent signal alteration within the cervical cord, in a distribution similar to previous examination, the overall size of the cortex is not as prominent, suggesting possible mild improvement, no new abnormal signals, no contrast enhancement.  MRI of the brain without contrast report, that he was stable white matter disease, in comparison to August 2013, the distribution of consistent with multiple sclerosis, no  contrast enhancement, 5 mm enhancing focus along the undersurface of right frontal lobe, suggesting a tiny meningioma, next lef  clinically patient is stable as well, his fatigue has much improved with ranitidine 5 mg twice a day, no significant gait difficulty, but occasionally bilateral lower extremity muscle spasm, jumpy movement, no visual change   UPDATE Dec 4th 2015: He is dong well, a lot better than 2014, he does not have those frequent episode of left jerking, ritalin 5 mg twice a day he has been very helpful, he was able to work full time  He has mild flushing with [name] treatment.  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  MRI of brain: there is no restricted diffusion to suggest an acute cortical or white matter infarct. The scattered foci of high signal within the subcortical and periventricular white matter are stable. There is no new focus of abnormal signal. There is no significant atrophy, or ventriculomegaly.  muscle spasticity. Overall he feels that things have remained stable. He returns today for an evaluation.  Update February 12th 2018: He has no clear significant clinical flareup, but over the past few years, he  has gradual worsening spastic gait, especially the right leg, he tends to drag his right leg, also mild right and arm weakness and numbness  We have personally reviewed MRI with and without contrast in 2017, MRI of the brain mild supratentorium lesions contrast enhancement MRI of  cervical and thoracic spine: Patchy area of cord hyperintensity, no contrast enhancement MRI of lumbar: Multilevel degenerative disc disease, no canal stenosis, mild foraminal stenosis at different levels  EMG nerve conduction study in February 2018 showed no significant abnormality, no evidence of lumbar stenosis.  He complains of fatigue, taking Ritalin 5 mg twice a day, bilateral lower extremity spasticity taking baclofen 10 mg 3 times a day, gabapentin 300 mg 2 tablets 3 times a day  Has been on [name] treatment, tolerating it well.  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  UPDATE September 12 2016: He continue complains of mild gait abnormality, dragging his right leg, difficulty climbing up steps, he is hesitate to start more aggressive treatment for his Relapsing Remitting Multiple Sclerosis, concern of potential side effect, we went over the potential side effect of Ocrelizumab in detail  Extensive laboratory evaluations in March 2018, showed negative QuantiFERON antibody, hepatitis B surface antigen, positive VZV IgG antibody, normal TSH, CMP,  UPDATE Feb 01 2017: He noticed more gait abnormality, unsteady, right leg is getting weaker, he has radiating pain to right leg, he contributed his right anterior thigh paresthesia from his previous right hernia surgery. He also has chronic low back pain, MRI Lumbar showed degenerative changes. EMG nerve conduction in February 2018 was essentially normal  MRI of the brain in June 2018 showed mild periventricular subcortical white matter disease, no contrast enhancement. MRI of cervical spine and thoracic spine with without contrast showed diffuse signal throughout the cervical cord, and scattered subtle foci of high signal intensity within the thoracic spine, no contrast enhancement,  He also complains of intermittent episode of urinary and bowel incontinence,  UPDATE May 08 2017: He got ocrevus infusion Dec 3, and Dec 18th, following 1st  infusion, he had a transient diffuse body achy pain, but did very well with his second infusion. He has mild weakness of right leg, right hip bursitis, right hernia surgery in the past, noticed some irritation of right groin area, shooting discomfort to right leg.  He also complains of  right leg jerking movement.  He fell in Jan 2019, due to right leg weakness.  Gradual worsening increased gait abnormality  UPDATE September 04 2017: This is his first EMG guided xeomin injection for spastic right lower extremity, used 200 units today, he is approved for ocrelizumab infusion at the end of June 2019   REVIEW OF SYSTEMS: Out of a complete 14 system review of symptoms, the patient complains only of the following symptoms, and all other reviewed systems are negative.  Fatigue, eye pain, murmur, incontinence of bowels, bladder, back pain, walking difficulty, dizziness, numbness, weakness  ALLERGIES: Allergies  Allergen Reactions  . Penicillins Other (See Comments)    Child-doesn't know    HOME MEDICATIONS: Outpatient Medications Prior to Visit  Medication Sig Dispense Refill  . baclofen (LIORESAL) 10 MG tablet Take 1 tablet (10 mg total) by mouth 3 (three) times daily. 270 each 3  . Cholecalciferol (VITAMIN D PO) Take 2,000 Units by mouth daily.     Marland Kitchen FLUoxetine (PROZAC) 40 MG capsule 40 mg.    . gabapentin (NEURONTIN) 300 MG capsule TAKE 2 CAPSULES THREE TIMES A DAY 540  capsule 0  . methylphenidate (RITALIN) 5 MG tablet Take 1 tablet (5 mg total) by mouth 2 (two) times daily. 60 tablet 0  . ocrelizumab 600 mg in sodium chloride 0.9 % 500 mL Inject 600 mg into the vein every 6 (six) months. Initial infusions: 03/05/17, 03/20/17    . pravastatin (PRAVACHOL) 10 MG tablet Take 10 mg by mouth daily.     No facility-administered medications prior to visit.     PAST MEDICAL HISTORY: Past Medical History:  Diagnosis Date  . Anxiety   . Depression   . Kidney stone   . MS (multiple sclerosis) (HCC)     . Unspecified cause of encephalitis, myelitis, and encephalomyelitis     PAST SURGICAL HISTORY: History reviewed. No pertinent surgical history.  FAMILY HISTORY: Family History  Problem Relation Age of Onset  . High Cholesterol Mother   . Heart Problems Mother   . Heart attack Father     SOCIAL HISTORY: Social History   Socioeconomic History  . Marital status: Married    Spouse name: cynthia  . Number of children: 2  . Years of education: college  . Highest education level: Not on file  Occupational History    Employer: [employer]  Social Needs  . Financial resource strain: Not on file  . Food insecurity:    Worry: Not on file    Inability: Not on file  . Transportation needs:    Medical: Not on file    Non-medical: Not on file  Tobacco Use  . Smoking status: Never Smoker  . Smokeless tobacco: Never Used  Substance and Sexual Activity  . Alcohol use: No    Alcohol/week: 0.0 oz  . Drug use: No  . Sexual activity: Not on file  Lifestyle  . Physical activity:    Days per week: Not on file    Minutes per session: Not on file  . Stress: Not on file  Relationships  . Social connections:    Talks on phone: Not on file    Gets together: Not on file    Attends religious service: Not on file    Active member of club or organization: Not on file    Attends meetings of clubs or organizations: Not on file    Relationship status: Not on file  . Intimate partner violence:    Fear of current or ex partner: Not on file    Emotionally abused: Not on file    Physically abused: Not on file    Forced sexual activity: Not on file  Other Topics Concern  . Not on file  Social History Narrative  . Not on file      PHYSICAL EXAM  Vitals:   09/04/17 1502  BP: (!) 163/92  Pulse: (!) 50  Weight: 216 lb (98 kg)  Height: 6' (1.829 m)   Body mass index is 29.29 kg/m.   PHYSICAL EXAMNIATION:  Gen: NAD, conversant, well nourised, obese, well groomed                      Cardiovascular: Regular rate rhythm, no peripheral edema, warm, nontender. Eyes: Conjunctivae clear without exudates or hemorrhage Neck: Supple, no carotid bruits. Pulmonary: Clear to auscultation bilaterally   NEUROLOGICAL EXAM:  MENTAL STATUS: Speech:    Speech is normal; fluent and spontaneous with normal comprehension.  Cognition:     Orientation to time, place and person     Normal recent and remote memory  Normal Attention span and concentration     Normal Language, naming, repeating,spontaneous speech     Fund of knowledge   CRANIAL NERVES: CN II: Visual fields are full to confrontation. Fundoscopic exam is normal with sharp discs and no vascular changes. Pupils are round equal and briskly reactive to light. CN III, IV, VI: extraocular movement are normal. No ptosis. CN V: Facial sensation is intact to pinprick in all 3 divisions bilaterally. Corneal responses are intact.  CN VII: Face is symmetric with normal eye closure and smile. CN VIII: Hearing is normal to rubbing fingers CN IX, X: Palate elevates symmetrically. Phonation is normal. CN XI: Head turning and shoulder shrug are intact CN XII: Tongue is midline with normal movements and no atrophy.  MOTOR: He has mild spasticity of bilateral lower extremity, He has mild bilateral shoulder abduction, external rotation weakness, right worse than left, he has mild bilateral lower extremity spasticity, right worse than left, moderate right hip flexion right ankle dorsiflexion weakness.   REFLEXES: Reflexes are 3 and symmetric at the biceps, triceps, knees, and ankles. Plantar responses are extensor bilaterally.  SENSORY: Intact to light touch, pinprick, positional and vibratory sensation are intact in fingers and toes.  COORDINATION: Rapid alternating movements and fine finger movements are intact. There is no dysmetria on finger-to-nose and heel-knee-shin.    GAIT/STANCE: Right leg  hemi-circumferential gait, dragging his right leg, mildly unsteady,    DIAGNOSTIC DATA (LABS, IMAGING, TESTING) - I reviewed patient records, labs, notes, testing and imaging myself where available.  Lab Results  Component Value Date   WBC 4.4 02/01/2017   HGB 14.5 02/01/2017   HCT 43.5 02/01/2017   MCV 90 02/01/2017   PLT 218 06/12/2016      Component Value Date/Time   NA 144 02/01/2017 0822   K 4.5 02/01/2017 0822   CL 104 02/01/2017 0822   CO2 24 02/01/2017 0822   GLUCOSE 83 02/01/2017 0822   BUN 22 02/01/2017 0822   CREATININE 0.81 02/01/2017 0822   CALCIUM 9.8 02/01/2017 0822   PROT 6.7 02/01/2017 0822   ALBUMIN 4.6 02/01/2017 0822   AST 15 02/01/2017 0822   ALT 14 02/01/2017 0822   ALKPHOS 74 02/01/2017 0822   BILITOT 0.7 02/01/2017 0822   GFRNONAA 99 02/01/2017 0822   GFRAA 115 02/01/2017 0822     ASSESSMENT AND PLAN 57 y.o. year old male   Relapsing remitting multiple sclerosis  He has slow decline of functional status with increased fatigue, gait abnormality, right leg weaknes despite [name] treatment.  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   Spasticity, gait abnormality  Baclofen 10 mg 3 times a day; gabapentin 300 mg 2 tablets 3 times a day,    Keep Cymbalta 60mg  daily  Emg guided xeomin injection, used 300 units (approved for 500 units), first injection September 04, 2017   Right adductor longus 25 units x4= 100 units Right femoris rectus 25 units Right vastus medialis 25 units Right vastus lateralis 25 units  Right tibialis posterior 50 units Right flexor digitorum longus 50 units Right extensor pollicis longus 25 units   Levert Feinstein, M.D. Ph.D.  [facility] [address]  [place], [place] [address] Phone: [phone] Fax:      [phone]

## 2017-09-05 ENCOUNTER — Telehealth: Payer: Self-pay | Admitting: Neurology

## 2017-09-05 NOTE — Telephone Encounter (Signed)
I spoke with the patient to inform him how the reimbursement program works. We will wait until he receives his EOB and then submit for the refund. Patient was appreciative. DW

## 2017-09-14 ENCOUNTER — Telehealth: Payer: Self-pay | Admitting: Neurology

## 2017-09-14 NOTE — Telephone Encounter (Signed)
Timothy Love from [facility] has called from [phone] xt [phone](Botox Team) [name] is asking that the [employer] [facility] [employer] team also be called  She gave the GSU#[id number] for [facility].  Please call

## 2017-09-17 NOTE — Telephone Encounter (Signed)
Pt said [facility] has advised him they do not have a PA request??? He is so confused

## 2017-09-17 NOTE — Telephone Encounter (Signed)
I spoke with the patient and informed him that the PA would be require before the next apt and we would take care of before then.

## 2017-09-17 NOTE — Telephone Encounter (Signed)
Noted, this will need to be done before his next apt. I will complete PA closer to time.

## 2017-09-25 NOTE — Telephone Encounter (Signed)
[facility] [phone] x [phone] called reg PA. Phone rep advised per note a PA would be done closer to the patients appt 8/28. She is asking to send PA to them also. [facility] patient account # [id number].  FYI

## 2017-09-25 NOTE — Telephone Encounter (Signed)
Noted, thank you

## 2017-10-03 ENCOUNTER — Telehealth: Payer: Self-pay | Admitting: Neurology

## 2017-10-03 NOTE — Telephone Encounter (Signed)
Spoke to patient - reports some worsening of his right foot drop and weakness since his injections on 09/04/17.  He is aware the Xeomin will gradually wear off over the course of twelve weeks.  He is able to walk and has not had any falls.  Dr. Terrace Arabia is aware and would like him to keep his pending appt on 11/28/17.  She will further evaluate him prior to his next injections.

## 2017-10-03 NOTE — Telephone Encounter (Signed)
Patient states Xeomin did not help with his walking and in fact it seems worse. Please call and discuss.

## 2017-11-16 ENCOUNTER — Telehealth: Payer: Self-pay | Admitting: Neurology

## 2017-11-16 DIAGNOSIS — R269 Unspecified abnormalities of gait and mobility: Secondary | ICD-10-CM

## 2017-11-16 DIAGNOSIS — M21371 Foot drop, right foot: Secondary | ICD-10-CM

## 2017-11-16 NOTE — Telephone Encounter (Signed)
Pt is requesting an order for a right foot brace for his drop foot.  Pt is asking the order be sent to [facility] [phone] fax#[phone]

## 2017-11-19 NOTE — Addendum Note (Signed)
Addended by: Levert Feinstein on: 11/19/2017 08:59 AM   Modules accepted: Orders

## 2017-11-19 NOTE — Addendum Note (Signed)
Addended by: Lindell Spar C on: 11/19/2017 09:54 AM   Modules accepted: Orders

## 2017-11-19 NOTE — Telephone Encounter (Signed)
Signed MD order faxed and confirmed to requested facility.

## 2017-11-19 NOTE — Telephone Encounter (Signed)
Noted  

## 2017-11-26 NOTE — Telephone Encounter (Signed)
Patient called and stated that the patient assistance programs needs his J code and CPT code I gave the patient [phone] and [phone].

## 2017-11-28 ENCOUNTER — Ambulatory Visit (INDEPENDENT_AMBULATORY_CARE_PROVIDER_SITE_OTHER): Payer: BLUE CROSS/BLUE SHIELD | Admitting: Neurology

## 2017-11-28 ENCOUNTER — Telehealth: Payer: Self-pay | Admitting: *Deleted

## 2017-11-28 ENCOUNTER — Encounter: Payer: Self-pay | Admitting: Neurology

## 2017-11-28 ENCOUNTER — Telehealth: Payer: Self-pay | Admitting: Neurology

## 2017-11-28 VITALS — BP 149/89 | HR 68 | Ht 72.0 in | Wt 216.5 lb

## 2017-11-28 DIAGNOSIS — R269 Unspecified abnormalities of gait and mobility: Secondary | ICD-10-CM

## 2017-11-28 DIAGNOSIS — G35 Multiple sclerosis: Secondary | ICD-10-CM

## 2017-11-28 MED ORDER — GABAPENTIN 300 MG PO CAPS: 600.0000 mg | ORAL_CAPSULE | Freq: Three times a day (TID) | ORAL | 4 refills | Status: DC

## 2017-11-28 MED ORDER — MODAFINIL 200 MG PO TABS
200.0000 mg | ORAL_TABLET | Freq: Every day | ORAL | 5 refills | Status: DC
Start: 2017-11-28 — End: 2018-06-27

## 2017-11-28 NOTE — Telephone Encounter (Signed)
pt is scheduled at [facility]. for 12/05/17 lvm for pt to be aware  BCBS Auth: [id number] (exp. 11/28/17 to 12/27/17)

## 2017-11-28 NOTE — Telephone Encounter (Signed)
Initiated PA modafinil on covermymeds. Key: [id number] - Rx #: [id number]. In process of completing.

## 2017-11-28 NOTE — Patient Instructions (Signed)
XFT-2001 Foot Drop System - Functional Electrical Stimulation (FES) Treatment

## 2017-11-28 NOTE — Progress Notes (Signed)
PATIENT: Timothy Love DOB: [birth year]  REASON FOR VISIT: follow up- multiple sclerosis HISTORY FROM: patient  HISTORY OF PRESENT ILLNESS:  HISTORY Timothy Love is a 57 years old right-handed Caucasian male, referred by his primary care physician Dr. Madelyn Flavors, and orthopedic surgeon Dr. 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 Clent Ridges, MRI of thoracic spine has demonstrate right upper thoracic spine, and the left lower thoracic spine T2 lesions, expending 1 to 2 segments  MRI of the brain with and without contrast in [facility] demonstrated right frontal T2 lesion, left coronal radiata lesions, there was one right parieto-occipital lobe with thin linear enhancing lesions.  He was not offered any treatment, overall he has much improved, now ambulates with mild unsteadiness, still has numbness at mid chest level, no incontinence, he also has occasional shooting numbness to his bilateral upper extremity, Lhermitte signs when he bends down his neck  I have reviewed the MRI film from [facility] diagnostic imaging in 2013, MRI cervical had patchy area of abnormality, at C2, 3, 4, 5, 6, T1-2.  CSF Nov 2013, more than 5 oligoclonal banding, WBC 9, above findings support definite diagnosis of relapsing remitting multiple sclerosis, he continued to have Lhermitt signs, ambulate without difficulty, no visual loss,   Visual evoked potential was abnormal, secondary to a prolonged P100 latency on the left. 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   UPDATE April 11 th 2014,  [name] was started in Jan 15th 2014, his insurance will not cover [name], overall he is doing well, he took the medicine Monday Wednesday Friday evening, proceeding with ibuprofen, he complains of mild worsening depression, he also has bilateral lower extremity increased tghtness. He still works full-time as Personnel officer, performing 95% off work capacity, avoiding high ladder climbing   UPDATE Feb 27th 2015: He was changed to [name] since Nov 2014, mild nause, flushing, after he had a repeat MRI brain and spine in Oct 2014, he had a second opinion at [facility] clinic, by [name] He is clinically stable, but on the repeat MRI of the brain, cervical, and thoracic spine, there was progression of disease, this was done in October 2014 at [facility]   MRI thoracic spine, Atrophic appearing thoracic cord with multiple T2 hyperintense lesions consistent with demyelinating plaques. No lesions demonstrate enhancement to indicate active demyelination.Compression deformity of the T10 vertebral body with edema and enhancement in the superior endplate, indicating that this is acute/subacute in nature. most notably at the T7-8 level appear larger than on the examination from 08/09/11. In addition, the T10 compression deformity described on the current study is new, and was not present on the 08/09/11 study  MRI cervical spine, the brainstem lesions described on the present examination from 01/03/13 appear new compared to the 02/19/12 study. Multiple lesions throughout the cervical cord appear overall unchanged. multiple T2 hyperintense plaques throughout  the cervical cord and proximal brainstem most notably the craniocervical junction and throughout the cervical cord extending from approximately C2-C6. No enhancement is  seen. There is mild multilevel uncovertebral and facet hypertrophy, but without significant canal or foraminal stenosis.  MRI of brain, Multiple T2 hyperintense lesions throughout the brainstem and cervical cord consistent with multiple sclerosis. No enhancement to suggest active demyelination.There is definitely a new lesion in the left periventricular white matter. There are also likely several new punctate lesions adjacent to the anterior horn of the cheeks and right lateral ventricle, however this may be exaggerated given differences in slice thickness between the 2 studies. The infratentorial lesions are not definitively seen on the prior study, however this may again be due to to technical factors.  He still works full times, at times, he complains of fatigue, legs gave up, hands does not cooperative, finger tips and feet paresthesia, gets tired, falling off ladders, loose balance, No significant bowel and bladder issues, urgerncy.  UPDATE May 8th 2015:  He had MRI at [place], [place], we have reviewed MRI thoracic spine, persistent signal alteration within the cord, in a similar distribution as the prior examination. The overall size of the cord however is not as prominent suggesting possible mild improvement. No new signal alteration is evident, and there is no  abnormal region of enhancement on the postcontrast portion of the examination. Minimal degenerative disc disease without significant spinal or  foraminal stenosis at any level.   MRI cervical spine: With and without contrast, persistent signal alteration within the cervical cord, in a distribution similar to previous examination, the overall size of the cortex is not as prominent, suggesting possible mild improvement, no new abnormal signals, no contrast enhancement.  MRI of the brain without contrast report, that he was stable white matter disease, in comparison to August 2013, the distribution of consistent with multiple sclerosis, no  contrast enhancement, 5 mm enhancing focus along the undersurface of right frontal lobe, suggesting a tiny meningioma, next lef  clinically patient is stable as well, his fatigue has much improved with ranitidine 5 mg twice a day, no significant gait difficulty, but occasionally bilateral lower extremity muscle spasm, jumpy movement, no visual change   UPDATE Dec 4th 2015: He is dong well, a lot better than 2014, he does not have those frequent episode of left jerking, ritalin 5 mg twice a day he has been very helpful, he was able to work full time  He has mild flushing with [name] treatment.  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  MRI of brain: there is no restricted diffusion to suggest an acute cortical or white matter infarct. The scattered foci of high signal within the subcortical and periventricular white matter are stable. There is no new focus of abnormal signal. There is no significant atrophy, or ventriculomegaly.  muscle spasticity. Overall he feels that things have remained stable. He returns today for an evaluation.  Update February 12th 2018: He has no clear significant clinical flareup, but over the past few years, he  has gradual worsening spastic gait, especially the right leg, he tends to drag his right leg, also mild right and arm weakness and numbness  We have personally reviewed MRI with and without contrast in 2017, MRI of the brain mild supratentorium lesions contrast enhancement MRI of  cervical and thoracic spine: Patchy area of cord hyperintensity, no contrast enhancement MRI of lumbar: Multilevel degenerative disc disease, no canal stenosis, mild foraminal stenosis at different levels  EMG nerve conduction study in February 2018 showed no significant abnormality, no evidence of lumbar stenosis.  He complains of fatigue, taking Ritalin 5 mg twice a day, bilateral lower extremity spasticity taking baclofen 10 mg 3 times a day, gabapentin 300 mg 2 tablets 3 times a day  Has been on [name] treatment, tolerating it well.  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  UPDATE September 12 2016: He continue complains of mild gait abnormality, dragging his right leg, difficulty climbing up steps, he is hesitate to start more aggressive treatment for his Relapsing Remitting Multiple Sclerosis, concern of potential side effect, we went over the potential side effect of Ocrelizumab in detail  Extensive laboratory evaluations in March 2018, showed negative QuantiFERON antibody, hepatitis B surface antigen, positive VZV IgG antibody, normal TSH, CMP,  UPDATE Feb 01 2017: He noticed more gait abnormality, unsteady, right leg is getting weaker, he has radiating pain to right leg, he contributed his right anterior thigh paresthesia from his previous right hernia surgery. He also has chronic low back pain, MRI Lumbar showed degenerative changes. EMG nerve conduction in February 2018 was essentially normal  MRI of the brain in June 2018 showed mild periventricular subcortical white matter disease, no contrast enhancement. MRI of cervical spine and thoracic spine with without contrast showed diffuse signal throughout the cervical cord, and scattered subtle foci of high signal intensity within the thoracic spine, no contrast enhancement,  He also complains of intermittent episode of urinary and bowel incontinence,  UPDATE May 08 2017: He got ocrevus infusion Dec 3, and Dec 18th, 2018  following 1st infusion, he had a transient diffuse body achy pain, but did very well with his second infusion. He has mild weakness of right leg, right hip bursitis, right hernia surgery in the past, noticed some irritation of right groin area, shooting discomfort to right leg.  He also complains of  right leg jerking movement.  He fell in Jan 2019, due to right leg weakness.  Gradual worsening increased gait abnormality  UPDATE November 28 2017: He had his 2nd Ocrevus in June 2019, he tolerated very well, he has pain at both shoulders.  He continues to have significant gait abnormality, Botox injection to right lower extremity in June 2019 made no difference,  He also complains of gradually worsening fatigue, on  REVIEW OF SYSTEMS: Out of a complete 14 system review of symptoms, the patient complains only of the following symptoms, and all other reviewed systems are negative.  Fatigue, eye pain, murmur, incontinence of bowels, bladder, back pain, walking difficulty, dizziness, numbness, weakness  ALLERGIES: Allergies  Allergen Reactions  . Penicillins Other (See Comments)    Child-doesn't know    HOME MEDICATIONS: Outpatient Medications Prior to Visit  Medication Sig Dispense Refill  . baclofen (LIORESAL) 10 MG tablet Take 1 tablet (10 mg total) by mouth 3 (three) times daily. 270 each 3  . Cholecalciferol (VITAMIN D PO) Take 2,000 Units by mouth daily.     Marland Kitchen FLUoxetine (PROZAC) 40 MG capsule 40  mg.    . gabapentin (NEURONTIN) 300 MG capsule TAKE 2 CAPSULES THREE TIMES A DAY 540 capsule 0  . methylphenidate (RITALIN) 5 MG tablet Take 1 tablet (5 mg total) by mouth 2 (two) times daily. 60 tablet 0  . ocrelizumab 600 mg in sodium chloride 0.9 % 500 mL Inject 600 mg into the vein every 6 (six) months. Initial infusions: 03/05/17, 03/20/17    . pravastatin (PRAVACHOL) 10 MG tablet Take 10 mg by mouth daily.    Marland Kitchen incobotulinumtoxinA (XEOMIN) 100 units injection 300 Units      No  facility-administered medications prior to visit.     PAST MEDICAL HISTORY: Past Medical History:  Diagnosis Date  . Anxiety   . Depression   . Kidney stone   . MS (multiple sclerosis) (HCC)   . Unspecified cause of encephalitis, myelitis, and encephalomyelitis     PAST SURGICAL HISTORY: History reviewed. No pertinent surgical history.  FAMILY HISTORY: Family History  Problem Relation Age of Onset  . High Cholesterol Mother   . Heart Problems Mother   . Heart attack Father     SOCIAL HISTORY: Social History   Socioeconomic History  . Marital status: Married    Spouse name: cynthia  . Number of children: 2  . Years of education: college  . Highest education level: Not on file  Occupational History    Employer: [employer]  Social Needs  . Financial resource strain: Not on file  . Food insecurity:    Worry: Not on file    Inability: Not on file  . Transportation needs:    Medical: Not on file    Non-medical: Not on file  Tobacco Use  . Smoking status: Never Smoker  . Smokeless tobacco: Never Used  Substance and Sexual Activity  . Alcohol use: No    Alcohol/week: 0.0 standard drinks  . Drug use: No  . Sexual activity: Not on file  Lifestyle  . Physical activity:    Days per week: Not on file    Minutes per session: Not on file  . Stress: Not on file  Relationships  . Social connections:    Talks on phone: Not on file    Gets together: Not on file    Attends religious service: Not on file    Active member of club or organization: Not on file    Attends meetings of clubs or organizations: Not on file    Relationship status: Not on file  . Intimate partner violence:    Fear of current or ex partner: Not on file    Emotionally abused: Not on file    Physically abused: Not on file    Forced sexual activity: Not on file  Other Topics Concern  . Not on file  Social History Narrative  . Not on file      PHYSICAL EXAM  Vitals:    11/28/17 1254  BP: (!) 149/89  Pulse: 68  Weight: 216 lb 8 oz (98.2 kg)  Height: 6' (1.829 m)   Body mass index is 29.36 kg/m.   PHYSICAL EXAMNIATION:  Gen: NAD, conversant, well nourised, obese, well groomed                     Cardiovascular: Regular rate rhythm, no peripheral edema, warm, nontender. Eyes: Conjunctivae clear without exudates or hemorrhage Neck: Supple, no carotid bruits. Pulmonary: Clear to auscultation bilaterally   NEUROLOGICAL EXAM:  MENTAL STATUS: Speech:  Speech is normal; fluent and spontaneous with normal comprehension.  Cognition:     Orientation to time, place and person     Normal recent and remote memory     Normal Attention span and concentration     Normal Language, naming, repeating,spontaneous speech     Fund of knowledge   CRANIAL NERVES: CN II: Visual fields are full to confrontation. Fundoscopic exam is normal with sharp discs and no vascular changes. Pupils are round equal and briskly reactive to light. CN III, IV, VI: extraocular movement are normal. No ptosis. CN V: Facial sensation is intact to pinprick in all 3 divisions bilaterally. Corneal responses are intact.  CN VII: Face is symmetric with normal eye closure and smile. CN VIII: Hearing is normal to rubbing fingers CN IX, X: Palate elevates symmetrically. Phonation is normal. CN XI: Head turning and shoulder shrug are intact CN XII: Tongue is midline with normal movements and no atrophy.  MOTOR: He has mild spasticity of bilateral lower extremity, He has mild bilateral shoulder abduction, external rotation weakness, right worse than left, he has mild bilateral lower extremity spasticity, right worse than left, moderate right hip flexion right ankle dorsiflexion weakness.   REFLEXES: Reflexes are 3 and symmetric at the biceps, triceps, knees, and ankles. Plantar responses are extensor bilaterally.  SENSORY: Intact to light touch, pinprick, positional and vibratory sensation  are intact in fingers and toes.  COORDINATION: Rapid alternating movements and fine finger movements are intact. There is no dysmetria on finger-to-nose and heel-knee-shin.    GAIT/STANCE: Right leg hemi-circumferential gait, dragging his right leg, mildly unsteady,    DIAGNOSTIC DATA (LABS, IMAGING, TESTING) - I reviewed patient records, labs, notes, testing and imaging myself where available.  Lab Results  Component Value Date   WBC 4.4 02/01/2017   HGB 14.5 02/01/2017   HCT 43.5 02/01/2017   MCV 90 02/01/2017   PLT 218 06/12/2016      Component Value Date/Time   NA 144 02/01/2017 0822   K 4.5 02/01/2017 0822   CL 104 02/01/2017 0822   CO2 24 02/01/2017 0822   GLUCOSE 83 02/01/2017 0822   BUN 22 02/01/2017 0822   CREATININE 0.81 02/01/2017 0822   CALCIUM 9.8 02/01/2017 0822   PROT 6.7 02/01/2017 0822   ALBUMIN 4.6 02/01/2017 0822   AST 15 02/01/2017 0822   ALT 14 02/01/2017 0822   ALKPHOS 74 02/01/2017 0822   BILITOT 0.7 02/01/2017 0822   GFRNONAA 99 02/01/2017 0822   GFRAA 115 02/01/2017 0822     ASSESSMENT AND PLAN 57 y.o. year old male   Relapsing remitting multiple sclerosis  He has slow decline of functional status with increased fatigue, gait abnormality, right leg weaknes worsening gait abnormality despite [name] treatment.  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  Laboratory evaluation today  Repeat MRI of the brain without contrast in December 2019   Spasticity, gait abnormality  Baclofen 10 mg 3 times a day; gabapentin 300 mg 2 tablets 3 times a day,   Emg guided xeomin injection in May 2018 made no significant difference,  Provide information for the electronic  stimulation gait device,  Referral to physical therapy  Worsening fatigue  He was getting  weakening 5 mg twice a day from his primary care physician,  Add on Provigil 200 mg daily, Levert Feinstein, M.D. Ph.D.  [facility] [address] [place], [place] [address] Phone: [phone] Fax:      [phone]

## 2017-11-28 NOTE — Telephone Encounter (Addendum)
Submitted PA and it was approved. PA Case [id number]. Effective 11/28/17-11/28/18. Faxed notice of approval to [employer] at [phone]. Received fax confirmation.

## 2017-11-29 LAB — COMPREHENSIVE METABOLIC PANEL
A/G RATIO: 2 (ref 1.2–2.2)
ALK PHOS: 101 IU/L (ref 39–117)
ALT: 13 IU/L (ref 0–44)
AST: 18 IU/L (ref 0–40)
Albumin: 4.5 g/dL (ref 3.5–5.5)
BILIRUBIN TOTAL: 0.6 mg/dL (ref 0.0–1.2)
BUN / CREAT RATIO: 26 — AB (ref 9–20)
BUN: 23 mg/dL (ref 6–24)
CHLORIDE: 104 mmol/L (ref 96–106)
CO2: 22 mmol/L (ref 20–29)
CREATININE: 0.89 mg/dL (ref 0.76–1.27)
Calcium: 9.7 mg/dL (ref 8.7–10.2)
GFR calc Af Amer: 110 mL/min/{1.73_m2} (ref >59)
GFR calc non Af Amer: 95 mL/min/{1.73_m2} (ref >59)
GLOBULIN, TOTAL: 2.3 g/dL (ref 1.5–4.5)
Glucose: 123 mg/dL — ABNORMAL HIGH (ref 65–99)
POTASSIUM: 4.4 mmol/L (ref 3.5–5.2)
SODIUM: 143 mmol/L (ref 134–144)
Total Protein: 6.8 g/dL (ref 6.0–8.5)

## 2017-11-29 LAB — CBC WITH DIFFERENTIAL/PLATELET
BASOS: 1 %
Basophils Absolute: 0 10*3/uL (ref 0.0–0.2)
EOS (ABSOLUTE): 0.1 10*3/uL (ref 0.0–0.4)
Eos: 2 %
HEMATOCRIT: 42.6 % (ref 37.5–51.0)
HEMOGLOBIN: 14.1 g/dL (ref 13.0–17.7)
Immature Grans (Abs): 0 10*3/uL (ref 0.0–0.1)
Immature Granulocytes: 0 %
LYMPHS ABS: 0.7 10*3/uL (ref 0.7–3.1)
LYMPHS: 12 %
MCH: 29 pg (ref 26.6–33.0)
MCHC: 33.1 g/dL (ref 31.5–35.7)
MCV: 88 fL (ref 79–97)
MONOS ABS: 0.5 10*3/uL (ref 0.1–0.9)
Monocytes: 9 %
NEUTROS ABS: 4.5 10*3/uL (ref 1.4–7.0)
NEUTROS PCT: 76 %
Platelets: 264 10*3/uL (ref 150–450)
RBC: 4.87 x10E6/uL (ref 4.14–5.80)
RDW: 12.7 % (ref 12.3–15.4)
WBC: 5.9 10*3/uL (ref 3.4–10.8)

## 2017-11-29 LAB — TSH: TSH: 0.95 u[IU]/mL (ref 0.450–4.500)

## 2017-12-04 ENCOUNTER — Telehealth: Payer: Self-pay | Admitting: Neurology

## 2017-12-04 MED ORDER — GABAPENTIN 300 MG PO CAPS: 600.0000 mg | ORAL_CAPSULE | Freq: Three times a day (TID) | ORAL | 3 refills | Status: DC

## 2017-12-04 NOTE — Telephone Encounter (Addendum)
Please send gabapentin rx to pharmacy for patient.  It is controlled in [place].

## 2017-12-04 NOTE — Addendum Note (Signed)
Addended by: Lindell Spar C on: 12/04/2017 04:20 PM   Modules accepted: Orders

## 2017-12-04 NOTE — Telephone Encounter (Signed)
Patient called and stated that he needs a refill on his Gabapentin sent to [facility]. Please advise.

## 2017-12-06 MED ORDER — GABAPENTIN 300 MG PO CAPS: 600.0000 mg | ORAL_CAPSULE | Freq: Three times a day (TID) | ORAL | 3 refills | Status: DC

## 2017-12-10 ENCOUNTER — Other Ambulatory Visit: Payer: Self-pay | Admitting: *Deleted

## 2017-12-10 MED ORDER — GABAPENTIN 300 MG PO CAPS: 600.0000 mg | ORAL_CAPSULE | Freq: Three times a day (TID) | ORAL | 1 refills | Status: DC

## 2017-12-10 NOTE — Telephone Encounter (Signed)
Pt called stating that the Rx for Gabapentin had failed, requesting RN resend it.

## 2017-12-10 NOTE — Addendum Note (Signed)
Addended by: Lindell Spar C on: 12/10/2017 04:30 PM   Modules accepted: Orders

## 2017-12-14 ENCOUNTER — Telehealth: Payer: Self-pay | Admitting: Neurology

## 2017-12-14 MED ORDER — GABAPENTIN 300 MG PO CAPS: ORAL_CAPSULE | ORAL | 1 refills | Status: DC

## 2017-12-14 NOTE — Telephone Encounter (Signed)
I called [facility] and cancelled Gabapentin rx.  New rx. sent to [facility]/fim

## 2017-12-14 NOTE — Addendum Note (Signed)
Addended by: Candis Schatz I on: 12/14/2017 11:24 AM   Modules accepted: Orders

## 2017-12-14 NOTE — Telephone Encounter (Signed)
Pt said RX at [facility] is tto expensive. He is asking to send to [facility] - [place], [place] - [address]

## 2017-12-17 MED ORDER — GABAPENTIN 600 MG PO TABS: 600.0000 mg | ORAL_TABLET | Freq: Three times a day (TID) | ORAL | 1 refills | Status: DC

## 2017-12-17 NOTE — Telephone Encounter (Signed)
Pt has called to make it known that re: his Gabapentin 600 MG tablets he in fact can get a 90 day and not just a 30 day.  Please have Rx called in for 90 day, no call back has been requested by pt.

## 2017-12-17 NOTE — Telephone Encounter (Signed)
Pt requesting a rx for Gabapentin 600 MG tablets sent to [facility] in [place]

## 2017-12-17 NOTE — Addendum Note (Signed)
Addended by: Candis Schatz I on: 12/17/2017 11:26 AM   Modules accepted: Orders

## 2017-12-17 NOTE — Telephone Encounter (Signed)
Spoke with pt.  He is currently taking Gabapentin 300mg  capsules, 2 capsules tid. The capsules are more expensive than the tablets, but the 300mg  dose does not come in tablets. Will send in 600mg  tablets/fim

## 2017-12-18 MED ORDER — GABAPENTIN 600 MG PO TABS: 600.0000 mg | ORAL_TABLET | Freq: Three times a day (TID) | ORAL | 1 refills | Status: DC

## 2017-12-18 NOTE — Telephone Encounter (Signed)
Escribed rx. failed. Rx. printed and will fax to [facility] once YY returns to the office tomorrow and signs it/fim

## 2017-12-18 NOTE — Addendum Note (Signed)
Addended by: Candis Schatz I on: 12/18/2017 05:01 PM   Modules accepted: Orders

## 2017-12-18 NOTE — Telephone Encounter (Signed)
Rx for Gabapentin failed, pt requesting RN resend

## 2017-12-19 NOTE — Telephone Encounter (Signed)
Rx. faxed as requested/fim 

## 2017-12-24 NOTE — Telephone Encounter (Signed)
error 

## 2018-01-16 ENCOUNTER — Telehealth: Payer: Self-pay | Admitting: Neurology

## 2018-01-16 NOTE — Telephone Encounter (Signed)
I have talked with his primary care NP Tacy Learn, drug screen for ritalin was negative on July 2019, refuse repeat drug screen test this month,   I started him on provigil on November 28 2017, 200mg  daily, he should only take provigil for his fatigue

## 2018-01-16 NOTE — Telephone Encounter (Signed)
Percell Belt, NP in [place] calling to discuss patient taking methylphenidate (RITALIN) 5 MG tablet and modafinil (PROVIGIL) 200 MG tablet. She can be reached at [phone].

## 2018-03-04 ENCOUNTER — Telehealth: Payer: Self-pay | Admitting: Neurology

## 2018-03-04 MED ORDER — BACLOFEN 10 MG PO TABS: 10.0000 mg | ORAL_TABLET | Freq: Three times a day (TID) | ORAL | 3 refills | Status: DC

## 2018-03-04 NOTE — Telephone Encounter (Signed)
The spelling of the correct pharmacy is actually [facility].  It has been added to his chart.  His refill of baclofen has been sent to them.

## 2018-03-04 NOTE — Telephone Encounter (Signed)
Left message requesting a return call.  I need to know which pharmacy he would like his baclofen sent.

## 2018-03-04 NOTE — Addendum Note (Signed)
Addended by: Lindell Spar C on: 03/04/2018 01:42 PM   Modules accepted: Orders

## 2018-03-04 NOTE — Telephone Encounter (Signed)
He is now using [facility] mail order pharmacy.  I am unable to locate the pharmacy's information.  I spoke to the patient.  He is going to call us back with the phone and fax number.

## 2018-03-04 NOTE — Telephone Encounter (Signed)
Pt has called back to inform RN Marcelino Duster the the pharmacy is [facility]

## 2018-03-04 NOTE — Telephone Encounter (Signed)
Patient called back and stated that he called [facility] and they took the order. No need to return call.

## 2018-03-04 NOTE — Telephone Encounter (Signed)
Patient called and requested to speak with someone regarding his baclofen. He needs a refill on this medication. He states that the pharmacy has changed and they need a new script. He did not have any details to the new pharmacy. It was [facility]. Please call and advise.

## 2018-03-07 NOTE — Telephone Encounter (Signed)
Update [employer] Auth: [id number] (exp. 03/07/18 to 04/05/18) patient is rescheduled at [facility] for 03/08/18.

## 2018-03-21 ENCOUNTER — Telehealth: Payer: Self-pay | Admitting: Neurology

## 2018-03-21 NOTE — Telephone Encounter (Signed)
Pt  Called and stated he has been trying to get scheduled for an infusion but he is getting the run around and he needs to be scheduled before the end of the year.  Because of this pt was given Londra's name and her [phone]

## 2018-03-21 NOTE — Telephone Encounter (Signed)
pt has called for the intrafusion suite, call transferred °

## 2018-03-25 ENCOUNTER — Telehealth: Payer: Self-pay | Admitting: Neurology

## 2018-03-25 NOTE — Telephone Encounter (Signed)
Spoke to patient - he is aware of his MRI results. 

## 2018-03-25 NOTE — Telephone Encounter (Signed)
MRI of brain with without contrast on March 08, 2018 from [place] [facility] showed stable lesions consistent with MS, no acute findings of new enhancement,

## 2018-04-08 ENCOUNTER — Other Ambulatory Visit: Payer: Self-pay | Admitting: Neurology

## 2018-04-08 ENCOUNTER — Telehealth: Payer: Self-pay | Admitting: *Deleted

## 2018-04-08 NOTE — Telephone Encounter (Signed)
Pt requesting a new rx for modafinil (PROVIGIL) 200 MG tablet sent to [facility]- due to an insurance change

## 2018-04-08 NOTE — Telephone Encounter (Signed)
PA for Modafinil 200mg  tablets completed via Cover My Meds. Key# [id number]. Dx: MS related fatigue (G35, R53.83). PA approved by [facility], for dates 04/08/18-04/08/19/fim

## 2018-04-08 NOTE — Telephone Encounter (Signed)
I called pt. Advised Dr. Terrace Arabia had sent refilled back in 11/2017 and he should have some at [facility]. After speaking with him, his wife had a change of insurance and rx requiring a new PA.   New insurance info: ID: [id number] Rxgroup: [id number], BIN: [id number], PCN: [id number] Phone: [phone].   Advised we will work on this for him.

## 2018-04-08 NOTE — Telephone Encounter (Signed)
PA for Modafinil 200mg  tablets completed via Cover My Meds, Key# [id number]. Dx: MS related fatigue (G35, R53.83).Chucky May

## 2018-05-10 ENCOUNTER — Telehealth: Payer: Self-pay | Admitting: Neurology

## 2018-05-10 NOTE — Telephone Encounter (Signed)
Pt called needing an explanation on when he is getting his B12 Injections again. Please advise.

## 2018-05-10 NOTE — Telephone Encounter (Signed)
I have spoken with the patient.  He has been to see his PCP to follow up on his B12. He will let them make the decision, based on his lab results, whether to continue B12 injections or convert to an oral supplement.

## 2018-06-06 ENCOUNTER — Other Ambulatory Visit: Payer: Self-pay

## 2018-06-06 ENCOUNTER — Ambulatory Visit (INDEPENDENT_AMBULATORY_CARE_PROVIDER_SITE_OTHER): Payer: Managed Care, Other (non HMO) | Admitting: Neurology

## 2018-06-06 ENCOUNTER — Encounter: Payer: Self-pay | Admitting: Neurology

## 2018-06-06 VITALS — BP 162/86 | HR 53 | Resp 16 | Ht 72.0 in | Wt 226.0 lb

## 2018-06-06 DIAGNOSIS — F329 Major depressive disorder, single episode, unspecified: Secondary | ICD-10-CM

## 2018-06-06 DIAGNOSIS — F32A Depression, unspecified: Secondary | ICD-10-CM

## 2018-06-06 DIAGNOSIS — R5383 Other fatigue: Secondary | ICD-10-CM | POA: Diagnosis not present

## 2018-06-06 DIAGNOSIS — R269 Unspecified abnormalities of gait and mobility: Secondary | ICD-10-CM | POA: Diagnosis not present

## 2018-06-06 DIAGNOSIS — R252 Cramp and spasm: Secondary | ICD-10-CM | POA: Diagnosis not present

## 2018-06-06 DIAGNOSIS — G35 Multiple sclerosis: Secondary | ICD-10-CM

## 2018-06-06 MED ORDER — DULOXETINE HCL 60 MG PO CPEP: 60.0000 mg | ORAL_CAPSULE | Freq: Every day | ORAL | 12 refills | Status: DC

## 2018-06-06 MED ORDER — CLONAZEPAM 0.5 MG PO TABS: 0.5000 mg | ORAL_TABLET | Freq: Every evening | ORAL | 5 refills | Status: AC | PRN

## 2018-06-06 NOTE — Progress Notes (Signed)
PATIENT: Timothy Love DOB: 1961-02-12  REASON FOR VISIT: follow up- multiple sclerosis HISTORY FROM: patient  HISTORY OF PRESENT ILLNESS:  HISTORY Timothy Love is a 58 years old right-handed Caucasian male, referred by his primary care physician Dr. Madelyn Flavors, and orthopedic surgeon Dr. 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 Clent Ridges, MRI of thoracic spine has demonstrate right upper thoracic spine, and the left lower thoracic spine T2 lesions, expending 1 to 2 segments  MRI of the brain with and without contrast in [facility] demonstrated right frontal T2 lesion, left coronal radiata lesions, there was one right parieto-occipital lobe with thin linear enhancing lesions.  He was not offered any treatment, overall he has much improved, now ambulates with mild unsteadiness, still has numbness at mid chest level, no incontinence, he also has occasional shooting numbness to his bilateral upper extremity, Lhermitte signs when he bends down his neck  I have reviewed the MRI film from [facility] diagnostic imaging in 2013, MRI cervical had patchy area of abnormality, at C2, 3, 4, 5, 6, T1-2.  CSF Nov 2013, more than 5 oligoclonal banding, WBC 9, above findings support definite diagnosis of relapsing remitting multiple sclerosis, he continued to have Lhermitt signs, ambulate without difficulty, no visual loss,   Visual evoked potential was abnormal, secondary to a prolonged P100 latency on the left. 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   UPDATE April 11 th 2014,  [name] was started in Jan 15th 2014, his insurance will not cover [name], overall he is doing well, he took the medicine Monday Wednesday Friday evening, proceeding with ibuprofen, he complains of mild worsening depression, he also has bilateral lower extremity increased tghtness. He still works full-time as Personnel officer, performing 95% off work capacity, avoiding high ladder climbing   UPDATE Feb 27th 2015: He was changed to [name] since Nov 2014, mild nause, flushing, after he had a repeat MRI brain and spine in Oct 2014, he had a second opinion at [facility], by [name] He is clinically stable, but on the repeat MRI of the brain, cervical, and thoracic spine, there was progression of disease, this was done in October 2014 at [facility]   MRI thoracic spine, Atrophic appearing thoracic cord with multiple T2 hyperintense lesions consistent with demyelinating plaques. No lesions demonstrate enhancement to indicate active demyelination.Compression deformity of the T10 vertebral body with edema and enhancement in the superior endplate, indicating that this is acute/subacute in nature. most notably at the T7-8 level appear larger than on the examination from 08/09/11. In addition, the T10 compression deformity described on the current study is new, and was not present on the 08/09/11 study  MRI cervical spine, the brainstem lesions described on the present examination from 01/03/13 appear new compared to the 02/19/12 study. Multiple lesions throughout the cervical cord appear overall unchanged. multiple T2 hyperintense plaques throughout  the cervical cord and proximal brainstem most notably the craniocervical junction and throughout the cervical cord extending from approximately C2-C6. No enhancement is  seen. There is mild multilevel uncovertebral and facet hypertrophy, but without significant canal or foraminal stenosis.  MRI of brain, Multiple T2 hyperintense lesions throughout the brainstem and cervical cord consistent with multiple sclerosis. No enhancement to suggest active demyelination.There is definitely a new lesion in the left periventricular white matter. There are also likely several new punctate lesions adjacent to the anterior horn of the cheeks and right lateral ventricle, however this may be exaggerated given differences in slice thickness between the 2 studies. The infratentorial lesions are not definitively seen on the prior study, however this may again be due to to technical factors.  He still works full times, at times, he complains of fatigue, legs gave up, hands does not cooperative, finger tips and feet paresthesia, gets tired, falling off ladders, loose balance, No significant bowel and bladder issues, urgerncy.  UPDATE May 8th 2015:  He had MRI at [place], [place], we have reviewed MRI thoracic spine, persistent signal alteration within the cord, in a similar distribution as the prior examination. The overall size of the cord however is not as prominent suggesting possible mild improvement. No new signal alteration is evident, and there is no  abnormal region of enhancement on the postcontrast portion of the examination. Minimal degenerative disc disease without significant spinal or  foraminal stenosis at any level.   MRI cervical spine: With and without contrast, persistent signal alteration within the cervical cord, in a distribution similar to previous examination, the overall size of the cortex is not as prominent, suggesting possible mild improvement, no new abnormal signals, no contrast enhancement.  MRI of the brain without contrast report, that he was stable white matter disease, in comparison to August 2013, the distribution of consistent with multiple sclerosis, no  contrast enhancement, 5 mm enhancing focus along the undersurface of right frontal lobe, suggesting a tiny meningioma, next lef  clinically patient is stable as well, his fatigue has much improved with ranitidine 5 mg twice a day, no significant gait difficulty, but occasionally bilateral lower extremity muscle spasm, jumpy movement, no visual change   UPDATE Dec 4th 2015: He is dong well, a lot better than 2014, he does not have those frequent episode of left jerking, ritalin 5 mg twice a day he has been very helpful, he was able to work full time  He has mild flushing with [name] treatment.  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  MRI of brain: there is no restricted diffusion to suggest an acute cortical or white matter infarct. The scattered foci of high signal within the subcortical and periventricular white matter are stable. There is no new focus of abnormal signal. There is no significant atrophy, or ventriculomegaly.  muscle spasticity. Overall he feels that things have remained stable. He returns today for an evaluation.  Update February 12th 2018: He has no clear significant clinical flareup, but over the past few years, he  has gradual worsening spastic gait, especially the right leg, he tends to drag his right leg, also mild right and arm weakness and numbness  We have personally reviewed MRI with and without contrast in 2017, MRI of the brain mild supratentorium lesions contrast enhancement MRI of  cervical and thoracic spine: Patchy area of cord hyperintensity, no contrast enhancement MRI of lumbar: Multilevel degenerative disc disease, no canal stenosis, mild foraminal stenosis at different levels  EMG nerve conduction study in February 2018 showed no significant abnormality, no evidence of lumbar stenosis.  He complains of fatigue, taking Ritalin 5 mg twice a day, bilateral lower extremity spasticity taking baclofen 10 mg 3 times a day, gabapentin 300 mg 2 tablets 3 times a day  Has been on [name] treatment, tolerating it well.  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  UPDATE September 12 2016: He continue complains of mild gait abnormality, dragging his right leg, difficulty climbing up steps, he is hesitate to start more aggressive treatment for his Relapsing Remitting Multiple Sclerosis, concern of potential side effect, we went over the potential side effect of Ocrelizumab in detail  Extensive laboratory evaluations in March 2018, showed negative QuantiFERON antibody, hepatitis B surface antigen, positive VZV IgG antibody, normal TSH, CMP,  UPDATE Feb 01 2017: He noticed more gait abnormality, unsteady, right leg is getting weaker, he has radiating pain to right leg, he contributed his right anterior thigh paresthesia from his previous right hernia surgery. He also has chronic low back pain, MRI Lumbar showed degenerative changes. EMG nerve conduction in February 2018 was essentially normal  MRI of the brain in June 2018 showed mild periventricular subcortical white matter disease, no contrast enhancement. MRI of cervical spine and thoracic spine with without contrast showed diffuse signal throughout the cervical cord, and scattered subtle foci of high signal intensity within the thoracic spine, no contrast enhancement,  He also complains of intermittent episode of urinary and bowel incontinence,  UPDATE May 08 2017: He got ocrevus infusion Dec 3, and Dec 18th, 2018  following 1st infusion, he had a transient diffuse body achy pain, but did very well with his second infusion. He has mild weakness of right leg, right hip bursitis, right hernia surgery in the past, noticed some irritation of right groin area, shooting discomfort to right leg.  He also complains of  right leg jerking movement.  He fell in Jan 2019, due to right leg weakness.  Gradual worsening increased gait abnormality  UPDATE November 28 2017: He had his 2nd Ocrevus in June 2019, he tolerated very well, he has pain at both shoulders.  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  We personally reviewed repeat MRI of the brain in December 2019 from [place], there was no significant changes, only mild lesion load,   REVIEW OF SYSTEMS: Out of a complete 14 system review of symptoms, the patient complains only of the following symptoms, and all other reviewed systems are negative.  Activity change, fatigue, hearing loss, ringing in ears, eye pain, blurred vision, murmur, incontinence of  bowel, restless leg, insomnia, daytime sleepiness, snoring, dizziness, numbness speech difficulty weakness, agitation, depression  All rest review of the system were negative  ALLERGIES: Allergies  Allergen Reactions  . Penicillins Other (See Comments)    Child-doesn't know    HOME MEDICATIONS: Outpatient Medications Prior to Visit  Medication Sig Dispense Refill  . baclofen (LIORESAL) 10 MG tablet Take 1 tablet (10 mg total) by mouth 3 (three) times daily.  270 each 3  . Cholecalciferol (VITAMIN D PO) Take 2,000 Units by mouth daily.     Marland Kitchen FLUoxetine (PROZAC) 40 MG capsule 40 mg.    . gabapentin (NEURONTIN) 600 MG tablet Take 1 tablet (600 mg total) by mouth 3 (three) times daily. 270 tablet 1  . modafinil (PROVIGIL) 200 MG tablet Take 1 tablet (200 mg total) by mouth daily. 30 tablet 5  . ocrelizumab 600 mg in sodium chloride 0.9 % 500 mL Inject 600 mg into the vein every 6 (six) months. Initial infusions: 03/05/17, 03/20/17    . pravastatin (PRAVACHOL) 10 MG tablet Take 10 mg by mouth daily.    . methylphenidate (RITALIN) 5 MG tablet Take 1 tablet (5 mg total) by mouth 2 (two) times daily. (Patient not taking: Reported on 06/06/2018) 60 tablet 0   No facility-administered medications prior to visit.     PAST MEDICAL HISTORY: Past Medical History:  Diagnosis Date  . Anxiety   . Depression   . Kidney stone   . MS (multiple sclerosis) (HCC)   . Unspecified cause of encephalitis, myelitis, and encephalomyelitis     PAST SURGICAL HISTORY: No past surgical history on file.  FAMILY HISTORY: Family History  Problem Relation Age of Onset  . High Cholesterol Mother   . Heart Problems Mother   . Heart attack Father     SOCIAL HISTORY: Social History   Socioeconomic History  . Marital status: Married    Spouse name: cynthia  . Number of children: 2  . Years of education: college  . Highest education level: Not on file  Occupational History    Employer: [employer]  Social Needs  . Financial resource strain: Not on file  . Food insecurity:    Worry: Not on file    Inability: Not on file  . Transportation needs:    Medical: Not on file    Non-medical: Not on file  Tobacco Use  . Smoking status: Never Smoker  . Smokeless tobacco: Never Used  Substance and Sexual Activity  . Alcohol use: No    Alcohol/week: 0.0 standard drinks  . Drug use: No  . Sexual activity: Not on file  Lifestyle  . Physical activity:     Days per week: Not on file    Minutes per session: Not on file  . Stress: Not on file  Relationships  . Social connections:    Talks on phone: Not on file    Gets together: Not on file    Attends religious service: Not on file    Active member of club or organization: Not on file    Attends meetings of clubs or organizations: Not on file    Relationship status: Not on file  . Intimate partner violence:    Fear of current or ex partner: Not on file    Emotionally abused: Not on file    Physically abused: Not on file    Forced sexual activity: Not on file  Other Topics Concern  . Not on file  Social  History Narrative  . Not on file      PHYSICAL EXAM  Vitals:   06/06/18 0716  BP: (!) 162/86  Pulse: (!) 53  Resp: 16  Weight: 226 lb (102.5 kg)  Height: 6' (1.829 m)   Body mass index is 30.65 kg/m.   PHYSICAL EXAMNIATION:  Gen: NAD, conversant, well nourised, obese, well groomed                     Cardiovascular: Regular rate rhythm, no peripheral edema, warm, nontender. Eyes: Conjunctivae clear without exudates or hemorrhage Neck: Supple, no carotid bruits. Pulmonary: Clear to auscultation bilaterally   NEUROLOGICAL EXAM:  MENTAL STATUS: Speech:    Speech is normal; fluent and spontaneous with normal comprehension.  Cognition:     Orientation to time, place and person     Normal recent and remote memory     Normal Attention span and concentration     Normal Language, naming, repeating,spontaneous speech     Fund of knowledge   CRANIAL NERVES: CN II: Visual fields are full to confrontation. Pupils are round equal and briskly reactive to light. CN III, IV, VI: extraocular movement are normal. No ptosis. CN V: Facial sensation is intact to pinprick in all 3 divisions bilaterally. Corneal responses are intact.  CN VII: Face is symmetric with normal eye closure and smile. CN VIII: Hearing is normal to rubbing fingers CN IX, X: Palate elevates symmetrically.  Phonation is normal. CN XI: Head turning and shoulder shrug are intact CN XII: Tongue is midline with normal movements and no atrophy.  MOTOR: Moderate spasticity of right lower extremity, lateral right ankle brace, mild to moderate right ankle dorsiflexion weakness,  REFLEXES: Reflexes are 3 and symmetric at the biceps, triceps, knees, and ankles. Plantar responses are extensor bilaterally.  SENSORY: Intact to light touch, pinprick, positional and vibratory sensation are intact in fingers and toes.  COORDINATION: Rapid alternating movements and fine finger movements are intact. There is no dysmetria on finger-to-nose and heel-knee-shin.    GAIT/STANCE: Right leg hemi-circumferential gait, dragging his right leg, mildly unsteady,    DIAGNOSTIC DATA (LABS, IMAGING, TESTING) - I reviewed patient records, labs, notes, testing and imaging myself where available.  Lab Results  Component Value Date   WBC 5.9 11/28/2017   HGB 14.1 11/28/2017   HCT 42.6 11/28/2017   MCV 88 11/28/2017   PLT 264 11/28/2017      Component Value Date/Time   NA 143 11/28/2017 1335   K 4.4 11/28/2017 1335   CL 104 11/28/2017 1335   CO2 22 11/28/2017 1335   GLUCOSE 123 (H) 11/28/2017 1335   BUN 23 11/28/2017 1335   CREATININE 0.89 11/28/2017 1335   CALCIUM 9.7 11/28/2017 1335   PROT 6.8 11/28/2017 1335   ALBUMIN 4.5 11/28/2017 1335   AST 18 11/28/2017 1335   ALT 13 11/28/2017 1335   ALKPHOS 101 11/28/2017 1335   BILITOT 0.6 11/28/2017 1335   GFRNONAA 95 11/28/2017 1335   GFRAA 110 11/28/2017 1335     ASSESSMENT AND PLAN 58 y.o. year old male   Relapsing remitting multiple sclerosis  He has slow decline of functional status with increased fatigue, gait abnormality, right leg weaknes worsening gait abnormality despite [name] treatment.  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  Started ocrelizumab infusion in December 2018, the first infusion was on December 3, second infusion was on March 20, 2017.  Repeat MRI of the brain without contrast in December 2019 showed no significant changes.  He needs to have repeat MRI cervical thoracic spine w/wo in Dec 2020.   Spasticity, gait abnormality  Baclofen 10 mg 3 times a day; gabapentin 300 mg 2 tablets 3 times a day,   Emg guided xeomin injection in May 2018 made no significant difference,     Fatigue    Provigil 200 mg daily,  Worsening Depression  Add on Cymbalta 60mg  daily, Clonazepam 0.5mg  qhs prn for insomnia Timothy Love, M.D. Ph.D.  [facility] [address] [place], [place] [address] Phone: [phone] Fax:      [phone]

## 2018-06-07 ENCOUNTER — Telehealth: Payer: Self-pay | Admitting: Neurology

## 2018-06-07 NOTE — Telephone Encounter (Signed)
Spoke to patient and he is aware that normal B12 levels range from [phone].  Per vo by Dr. Terrace Arabia, his PCP should recheck his levels. There is no specific number he should be at.  If he is back in normal range, then his PCP can decide whether he should continue injections or switch over to an oral supplement.  He will follow up with his PCP.

## 2018-06-07 NOTE — Telephone Encounter (Signed)
Patient is calling in and stated his PCP wants to know where his B12 level needs to be at

## 2018-06-09 ENCOUNTER — Other Ambulatory Visit: Payer: Self-pay | Admitting: Neurology

## 2018-06-26 ENCOUNTER — Other Ambulatory Visit: Payer: Self-pay | Admitting: Neurology

## 2018-06-26 NOTE — Telephone Encounter (Signed)
Pt has called asking for a refill on his modafinil (PROVIGIL) 200 MG tablet [facility] [phone]

## 2018-06-27 MED ORDER — MODAFINIL 200 MG PO TABS: 200.0000 mg | ORAL_TABLET | Freq: Every day | ORAL | 5 refills | Status: DC

## 2018-06-27 NOTE — Addendum Note (Signed)
Addended by: Lilla Shook on: 06/27/2018 08:29 AM   Modules accepted: Orders

## 2018-07-30 ENCOUNTER — Telehealth: Payer: Self-pay | Admitting: Neurology

## 2018-07-30 MED ORDER — BACLOFEN 10 MG PO TABS: 10.0000 mg | ORAL_TABLET | Freq: Three times a day (TID) | ORAL | 3 refills | Status: DC

## 2018-07-30 NOTE — Telephone Encounter (Signed)
Refills sent to requested pharmacy.  Pt aware.

## 2018-07-30 NOTE — Telephone Encounter (Signed)
Pt called needing to switch his baclofen (LIORESAL) 10 MG tablet to the pharmacy at [facility] in [place] on [address] Please advise.

## 2018-12-13 ENCOUNTER — Ambulatory Visit: Payer: Managed Care, Other (non HMO) | Admitting: Neurology

## 2018-12-18 ENCOUNTER — Other Ambulatory Visit: Payer: Self-pay | Admitting: Neurology

## 2018-12-24 ENCOUNTER — Ambulatory Visit: Payer: Managed Care, Other (non HMO) | Admitting: Neurology

## 2019-01-04 ENCOUNTER — Other Ambulatory Visit: Payer: Self-pay | Admitting: Neurology

## 2019-02-23 NOTE — Progress Notes (Signed)
Virtual Visit via Video Note  I connected with Timothy DuttonMartin Seydel on 02/24/19 at  3:45 PM EST by a video enabled telemedicine application and verified that I am speaking with the correct person using two identifiers.  Location: Patient: At his home Provider: In the office    I discussed the limitations of evaluation and management by telemedicine and the availability of in person appointments. The patient expressed understanding and agreed to proceed.  History of Present Illness:  HISTORY Timothy Love is a 58 years old right-handed Caucasian male, referred by his primary care physician Dr. Madelyn FlavorsSydney Harris, and orthopedic surgeon Dr. 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 Clent RidgesWalsh, MRI of thoracic spine has demonstrate right upper thoracic spine, and the left lower thoracic spine T2 lesions, expending 1 to 2 segments  [facility] with and [facility] demonstrated right frontal T2 lesion, left coronal radiata lesions, there was one right parieto-occipital lobe with thin linear enhancing lesions.  He was not offered any treatment, overall he has much improved, now ambulates with mild unsteadiness, still has numbness at mid chest level, no incontinence, he also has occasional shooting numbness to his bilateral upper extremity, Lhermitte signs when he bends down his neck  I have reviewed the [facility], MRI cervical had patchy area of abnormality, at C2, 3, 4,  5, 6, T1-2.  CSF Nov 2013, more than 5 oligoclonal banding, WBC 9, above findings support definite diagnosis of relapsing remitting multiple sclerosis, he continued to have Lhermitt signs, ambulate without difficulty, no visual loss,   Visual evoked potential was abnormal, secondary to a prolonged P100 latency on the left. 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   UPDATE April 11 th 2014,  [name] was started in Jan 15th 2014, his insurance will not cover [name], overall he is doing well, he took the medicine Monday Wednesday Friday evening, proceeding with ibuprofen, he complains of mild worsening depression, he also has bilateral lower extremity increased tghtness. He still works full-time as Personnel officerelectrician, performing 95% off work capacity, avoiding high ladder climbing   UPDATE Feb 27th 2015: He was changed to [place] since Nov 2014, mild nause, flushing, after he had a repeat MRI brain and spine in Oct 2014, he had a second opinion at [facility], by Yolande JollyZelasky, [name] He is clinically stable, but on the repeat MRI of the brain, cervical, and thoracic spine, there was progression of disease, this was [facility]   MRI thoracic spine, Atrophic appearing thoracic cord with multiple T2 hyperintense lesions consistent with demyelinating plaques. No lesions demonstrate enhancement to indicate active demyelination.Compression deformity of the T10 vertebral body with edema and enhancement in the superior endplate, indicating that this is acute/subacute in nature.most notably at the T7-8 level appear larger than on the examination  from 08/09/11.In addition, the T10 compression deformity described on the current study is new, and was not present on the 08/09/11 study  MRI cervical spine, the brainstem lesions described on the present  examination from 01/03/13 appear new compared to the 02/19/12 study.Multiple lesions throughout the cervical cord appear overall unchanged. multiple T2 hyperintense plaques throughout the cervical cord and proximal brainstem most notably the craniocervical junction and throughout the cervical cord extending from approximately C2-C6. No enhancement is seen. There is mild multilevel uncovertebral and facet hypertrophy, but without significant canal or foraminal stenosis.  MRI of brain, Multiple T2 hyperintense lesions throughout the brainstem and cervical cord consistent with multiple sclerosis. No enhancement to suggest active demyelination.There is definitely a new lesion in the left periventricular white matter. There are also likely several new punctate lesions adjacent to the anterior horn of the cheeks and right lateral ventricle, however this may be exaggerated given differences in slice thickness between the 2 studies. The infratentorial lesions are not definitively seen on the prior study, however this may again be due to to technical factors. He still works full times, at times, he complains of fatigue, legs gave up, hands does not cooperative, finger tips and feet paresthesia, gets tired, falling off ladders, loose balance, No significant bowel and bladder issues, urgerncy.  UPDATE May 8th 2015:  He had MRI at [place], [place], we have reviewed MRI thoracic spine, persistent signal alteration within the cord, in a similar distribution as the prior examination. The overall size of the cord however is not as prominent suggesting possible mild improvement. No new signal alteration is evident, and there is no  abnormal region of enhancement on the postcontrast portion of the examination. Minimal degenerative disc disease without significant spinal or  foraminal stenosis at any level.  MRI cervical spine: With and without contrast, persistent signal alteration within the cervical cord, in a  distribution similar to previous examination, the overall size of the cortex is not as prominent, suggesting possible mild improvement, no new abnormal signals, no contrast enhancement.  MRI of the brain without contrast report, that he was stable white matter disease, in comparison to August 2013, the distribution of consistent with multiple sclerosis, no contrast enhancement, 5 mm enhancing focus along the undersurface of right frontal lobe, suggesting a tiny meningioma, next lef  clinically patient is stable as well, his fatigue has much improved with ranitidine 5 mg twice a day, no significant gait difficulty, but occasionally bilateral lower extremity muscle spasm, jumpy movement, no visual change   UPDATE Dec 4th 2015: He is dong well, a lot better than 2014, he does not have those frequent episode of left jerking, ritalin 5 mg twice a day he has been very helpful, he was able to work full time  He has mild flushing with [name] treatment.  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  MRI of brain: there is no restricted diffusion to suggest an acute cortical or white matter infarct. The scattered foci of high signal within the subcortical and periventricular white  matter are stable. There is no new focus of abnormal signal. There is no significant atrophy, or ventriculomegaly.  muscle spasticity. Overall he feels that things have remained stable. He returns today for an evaluation.  Update February 12th 2018: He  has no clear significant clinical flareup, but over the past few years, he has gradual worsening spastic gait, especially the right leg, he tends to drag his right leg, also mild right and arm weakness and numbness  We have personally reviewed MRI with and without contrast in 2017, MRI of the brain mild supratentorium lesions contrast enhancement MRI of cervical and thoracic spine: Patchy area of cord hyperintensity, no contrast enhancement MRI of lumbar: Multilevel degenerative disc disease, no canal stenosis, mild foraminal stenosis at different levels  EMG nerve conduction study in February 2018 showed no significant abnormality, no evidence of lumbar stenosis.  He complains of fatigue, taking Ritalin 5 mg twice a day, bilateral lower extremity spasticity taking baclofen 10 mg 3 times a day, gabapentin 300 mg 2 tablets 3 times a day  Has been on [name] treatment, tolerating it well.  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  UPDATE September 12 2016: He continue complains of mild gait abnormality, dragging his right leg, difficulty climbing up steps, he is hesitate to start more aggressive treatment for his Relapsing Remitting Multiple Sclerosis, concern of potential side effect, we went over the potential side effect of Ocrelizumab in detail  Extensive laboratory evaluations in March 2018, showed negative QuantiFERON antibody, hepatitis B surface antigen, positive VZV IgG antibody, normal TSH, CMP,  UPDATE Feb 01 2017: He noticed more gait abnormality, unsteady, right leg is getting weaker, he has radiating pain to right leg, he contributed his right anterior thigh paresthesia from his previous right hernia surgery. He also has chronic low back pain, MRI Lumbar showed degenerative changes. EMG nerve conduction in February 2018 was essentially normal  MRI of the brain in June 2018 showed mild periventricular subcortical white matter disease, no contrast enhancement. MRI of  cervical spine and thoracic spine with without contrast showed diffuse signal throughout the cervical cord, and scattered subtle foci of high signal intensity within the thoracic spine, no contrast enhancement,  He also complains of intermittent episode of urinary and bowel incontinence,  UPDATE May 08 2017: He got ocrevus infusion Dec 3, and Dec 18th, 2018 following 1st infusion, he had a transient diffuse body achy pain, but did very well with his second infusion. He has mild weakness of right leg, right hip bursitis, right hernia surgery in the past, noticed some irritation of right groin area, shooting discomfort to right leg.  He also complains of  right leg jerking movement.  He fell in Jan 2019, due to right leg weakness.  Gradual worsening increased gait abnormality  UPDATE November 28 2017: He had his 2nd Ocrevus in June 2019, he tolerated very well, he has pain at both shoulders.  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  We personally reviewed repeat MRI of the brain in December 2019 from [place],  there was no significant changes, only mild lesion load,  Update February 24, 2019 SS: Overall, indicates his MS has been stable, overtime gradual progression.  Is receiving Ocrevus, last infusion was in June 2020, is scheduled for December.  He indicates he has good and bad days, report worsening gait abnormality, notices increased weakness as he nears infusion date.  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  At night, he does not have difficulty falling asleep, but has trouble staying asleep.  He may have an occasional spasm in his legs at bedtime.  He reports his depression has improved.  He denies any new MS symptoms, with his gait he continues to drag his right leg.  He presents for follow-up via virtual visit.  He remains on gabapentin, baclofen, Provigil, Cymbalta, but has not been taking clonazepam frequently.  He may have an occasional fall when he loses his balance, especially walking on grass.  Observations/Objective: Via virtual visit, is alert and oriented, excellent historian, speech is clear and concise, facial symmetry, answers questions appropriately, no arm drift,, with gait, drags right leg  Assessment and Plan: 1. Relapsing remitting multiple sclerosis -Slow decline in functional status with increased fatigue, gait abnormality, right leg weakness, worsening gait abnormality -Positive JCV with titer more than 3.15 in 2015 -MRI of the brain, cervical, thoracic spine with and without contrast in June 2018 showed diffuse cervical and thoracic spine cord lesion -He was treated with [name] January-November 2014, had worsening depression, [name] November 2014 to November 2018 -Started Ocrevus December 2018 -MRI of the brain without contrast in December 2019 showed no significant changes -I will order MRI of cervical and thoracic spine with and without contrast -I will order CBC, CMP, CD19/CD20, labs will be collected when he comes for Ocrevus infusion in a few weeks  2.  Spasticity, gait abnormality -Continue baclofen 10 mg 3 times a day, gabapentin 300 mg, 2 tablets 3 times a day -EMG guided Xeomin in May 2018 made no significant difference  3.  Fatigue -Continue Provigil 200 mg daily  4.  Worsening depression -Continue Cymbalta 60 mg daily -Continue Clonazepam 0.5 mg at bedtime as needed for insomnia   Follow Up Instructions: 6 months with Dr. Krista Blue  08/25/2019  8:00   I discussed the assessment and treatment plan with the patient. The patient was provided an opportunity to ask questions and all were answered. The patient agreed with the plan and demonstrated an understanding of the instructions.   The patient was advised to call back or seek an in-person evaluation if the symptoms worsen or if the condition fails to improve as anticipated.  I provided 25 minutes of non-face-to-face time during this encounter.   Evangeline Dakin, DNP  [facility] [address] [place], [place] [address] [phone]

## 2019-02-24 ENCOUNTER — Encounter: Payer: Self-pay | Admitting: Neurology

## 2019-02-24 ENCOUNTER — Other Ambulatory Visit: Payer: Self-pay

## 2019-02-24 ENCOUNTER — Telehealth (INDEPENDENT_AMBULATORY_CARE_PROVIDER_SITE_OTHER): Payer: Managed Care, Other (non HMO) | Admitting: Neurology

## 2019-02-24 DIAGNOSIS — G35 Multiple sclerosis: Secondary | ICD-10-CM | POA: Diagnosis not present

## 2019-02-24 DIAGNOSIS — R269 Unspecified abnormalities of gait and mobility: Secondary | ICD-10-CM

## 2019-02-24 DIAGNOSIS — F329 Major depressive disorder, single episode, unspecified: Secondary | ICD-10-CM | POA: Diagnosis not present

## 2019-02-24 DIAGNOSIS — F32A Depression, unspecified: Secondary | ICD-10-CM

## 2019-02-26 ENCOUNTER — Telehealth: Payer: Self-pay | Admitting: Neurology

## 2019-02-26 NOTE — Telephone Encounter (Signed)
Patient called to advise he would like to get his MRI's done at [facility] instead of [facility]. Please follow up.

## 2019-03-03 NOTE — Telephone Encounter (Signed)
Sent to [facility] [phone] - fax [phone] .

## 2019-03-04 NOTE — Telephone Encounter (Signed)
Patient is scheduled for 03/21/1999 . Patient is aware . MRI

## 2019-03-17 NOTE — Addendum Note (Signed)
Addended by: Inis Sizer D on: 03/17/2019 01:36 PM   Modules accepted: Orders

## 2019-03-19 NOTE — Progress Notes (Signed)
I have reviewed and agreed above plan. 

## 2019-03-20 LAB — COMPREHENSIVE METABOLIC PANEL
ALT: 23 IU/L (ref 0–44)
AST: 20 IU/L (ref 0–40)
Albumin/Globulin Ratio: 2.2 (ref 1.2–2.2)
Albumin: 4.3 g/dL (ref 3.8–4.9)
Alkaline Phosphatase: 123 IU/L — ABNORMAL HIGH (ref 39–117)
BUN/Creatinine Ratio: 25 — ABNORMAL HIGH (ref 9–20)
BUN: 22 mg/dL (ref 6–24)
Bilirubin Total: <0.2 mg/dL (ref 0.0–1.2)
CO2: 19 mmol/L — ABNORMAL LOW (ref 20–29)
Calcium: 9.5 mg/dL (ref 8.7–10.2)
Chloride: 105 mmol/L (ref 96–106)
Creatinine, Ser: 0.87 mg/dL (ref 0.76–1.27)
GFR calc Af Amer: 110 mL/min/{1.73_m2} (ref >59)
GFR calc non Af Amer: 95 mL/min/{1.73_m2} (ref >59)
Globulin, Total: 2 g/dL (ref 1.5–4.5)
Glucose: 165 mg/dL — ABNORMAL HIGH (ref 65–99)
Potassium: 4.3 mmol/L (ref 3.5–5.2)
Sodium: 140 mmol/L (ref 134–144)
Total Protein: 6.3 g/dL (ref 6.0–8.5)

## 2019-03-20 LAB — CBC WITH DIFFERENTIAL/PLATELET
Basophils Absolute: 0.1 10*3/uL (ref 0.0–0.2)
Basos: 1 %
EOS (ABSOLUTE): 0 10*3/uL (ref 0.0–0.4)
Eos: 0 %
Hematocrit: 44.1 % (ref 37.5–51.0)
Hemoglobin: 14.3 g/dL (ref 13.0–17.7)
Immature Grans (Abs): 0 10*3/uL (ref 0.0–0.1)
Immature Granulocytes: 0 %
Lymphocytes Absolute: 0.4 10*3/uL — ABNORMAL LOW (ref 0.7–3.1)
Lymphs: 7 %
MCH: 28.9 pg (ref 26.6–33.0)
MCHC: 32.4 g/dL (ref 31.5–35.7)
MCV: 89 fL (ref 79–97)
Monocytes Absolute: 0.1 10*3/uL (ref 0.1–0.9)
Monocytes: 1 %
Neutrophils Absolute: 5.2 10*3/uL (ref 1.4–7.0)
Neutrophils: 91 %
Platelets: 292 10*3/uL (ref 150–450)
RBC: 4.95 x10E6/uL (ref 4.14–5.80)
RDW: 13.1 % (ref 11.6–15.4)
WBC: 5.7 10*3/uL (ref 3.4–10.8)

## 2019-03-20 LAB — CD19 AND CD20, FLOW CYTOMETRY

## 2019-03-24 ENCOUNTER — Telehealth: Payer: Self-pay | Admitting: *Deleted

## 2019-03-24 ENCOUNTER — Telehealth: Payer: Self-pay

## 2019-03-24 NOTE — Telephone Encounter (Signed)
Pt returned call and I was able to go over the results with him.   Pt demonstrated understanding and had no further questions at this time.

## 2019-03-24 NOTE — Telephone Encounter (Signed)
Received MRI reports with results (MRI cervical and thoracic) from [facility] [place].  [phone], fx [phone].

## 2019-03-24 NOTE — Telephone Encounter (Signed)
Please call the patient. Mr. Caffrey is on Ocrevus.  MRI of the brain in December 2019 showed no significant changes, after last visit was sent for MRI of cervical spine and thoracic spine.  I received the report from [facility] for MRIs done 03/21/2019.  MRI of the thoracic spine was stable, scattered foci of signal alteration within the cord are relatively stable compared to prior examination.  There is no abnormal region of enhancement or enhancing lesion on the postcontrast portion of the exam.  The chronic compression of T7 and T10 is again noted, stable in degree of compression.  There is no retropulsion.  The degenerative disc disease is very mild diffusely, without significant spinal or foraminal stenosis at any level.  MRI of cervical spine, the signal alteration within the cord has progressed mildly compared to the prior examination.  There is no enhancement on the postcontrast portion of the exam.  The degenerative disc disease is quite mild.  Overall, it seems the thoracic spine has remained stable, slight progression of the cervical spine.  He will remain on Ocrevus. I will have Dr. Krista Blue review the scans when she returns.  Recent MRIs were compared to June 2018.

## 2019-03-24 NOTE — Telephone Encounter (Signed)
I called pt and relayed that the MRI results of cervical and thoracic from SS/NP.  (overall MRI thoracic stable and cervical slight progression.  He verbalized understanding.  Understands that Dr. Krista Blue out and will review scans when she returns.  These were compared to 2018 scans.

## 2019-03-24 NOTE — Telephone Encounter (Signed)
Called pt on behalf of NP Butler Denmark. Regarding pts recent lab results CD19 and CD20, and Flow Cytometry.   No answer left VM for call back . Please relay results.Marland Kitchen "Please call the patient. CD 19, CD 20 is not identified, which is expected for Ocrevus. Glucose was 165, alkaline phosphatase was mildly elevated 123. Absolute lymphocyte was 0.4, will monitor overtime, no changes." -NP Butler Denmark

## 2019-04-01 NOTE — Telephone Encounter (Signed)
I called pt and relayed htat per SS/NP and Dr. Krista Blue that he is to bring CDs of 2018 and most recents MRI to office at appt in May and they will review at that time.  He verbalized understanding.

## 2019-04-01 NOTE — Telephone Encounter (Signed)
Please let him know, Dr. Krista Blue requested he get the CDs for his most recent MRIs cervical spine and thoracic spine, along with the MRIs from 2018 for her to review at his next office visit in May. They will review the scans together at that time.

## 2019-05-10 ENCOUNTER — Other Ambulatory Visit: Payer: Self-pay | Admitting: Neurology

## 2019-06-04 ENCOUNTER — Telehealth: Payer: Self-pay | Admitting: Neurology

## 2019-06-04 NOTE — Telephone Encounter (Signed)
Yes, we are recommending COVID vaccine for MS patients at this point.

## 2019-06-04 NOTE — Telephone Encounter (Signed)
Pt called wanting to know if he is able to get the Covid Vaccination since he is on MS Medication. Please advise.

## 2019-06-04 NOTE — Telephone Encounter (Signed)
I relayed that per SS?NP we are recommending that all pt with MS get the covid vaccine.  He verbalized understanding.

## 2019-06-27 ENCOUNTER — Other Ambulatory Visit: Payer: Self-pay | Admitting: Neurology

## 2019-07-05 ENCOUNTER — Other Ambulatory Visit: Payer: Self-pay | Admitting: Neurology

## 2019-07-07 ENCOUNTER — Telehealth: Payer: Self-pay | Admitting: *Deleted

## 2019-07-07 NOTE — Telephone Encounter (Signed)
I received a fax from [employer] that stated his plan required further information. The letter provided the number [phone] to call to provide additional clinical information. I was taken to a secure voicemail with a message stating they would return calls within 24-72 hours to complete the case. His reference number for the PA request is [id number].

## 2019-07-07 NOTE — Telephone Encounter (Signed)
PA for modafinil 200mg  started on covermymeds (key: [id number]). [employer]. Pt . Decision pending.

## 2019-07-08 NOTE — Telephone Encounter (Signed)
His plan never reached out to our office for further review of his modafinil. However, we received a letter of approval today via fax. The approval is effective through 07/07/2020.

## 2019-08-25 ENCOUNTER — Encounter: Payer: Self-pay | Admitting: Neurology

## 2019-08-25 ENCOUNTER — Other Ambulatory Visit: Payer: Self-pay

## 2019-08-25 ENCOUNTER — Ambulatory Visit: Payer: BC Managed Care – PPO | Admitting: Neurology

## 2019-08-25 VITALS — BP 142/82 | HR 72 | Ht 72.0 in | Wt 228.0 lb

## 2019-08-25 DIAGNOSIS — G35 Multiple sclerosis: Secondary | ICD-10-CM | POA: Diagnosis not present

## 2019-08-25 DIAGNOSIS — R252 Cramp and spasm: Secondary | ICD-10-CM

## 2019-08-25 DIAGNOSIS — R3915 Urgency of urination: Secondary | ICD-10-CM

## 2019-08-25 DIAGNOSIS — R269 Unspecified abnormalities of gait and mobility: Secondary | ICD-10-CM | POA: Diagnosis not present

## 2019-08-25 DIAGNOSIS — F32A Depression, unspecified: Secondary | ICD-10-CM

## 2019-08-25 DIAGNOSIS — F329 Major depressive disorder, single episode, unspecified: Secondary | ICD-10-CM

## 2019-08-25 MED ORDER — OXYBUTYNIN CHLORIDE ER 5 MG PO TB24: 5.0000 mg | ORAL_TABLET | Freq: Every day | ORAL | 11 refills | Status: DC

## 2019-08-25 MED ORDER — PREGABALIN 150 MG PO CAPS: 150.0000 mg | ORAL_CAPSULE | Freq: Three times a day (TID) | ORAL | 4 refills | Status: DC

## 2019-08-25 MED ORDER — MODAFINIL 200 MG PO TABS: 200.0000 mg | ORAL_TABLET | Freq: Every day | ORAL | 3 refills | Status: AC

## 2019-08-25 MED ORDER — BACLOFEN 10 MG PO TABS: 10.0000 mg | ORAL_TABLET | Freq: Three times a day (TID) | ORAL | 4 refills | Status: DC

## 2019-08-25 NOTE — Progress Notes (Addendum)
PATIENT: Timothy Love DOB: Jul 19, 1957  Chief Complaint  Patient presents with  . Multiple Sclerosis    He is here with his wife, [name]. He would like to review his MRI scans. He is having problems with his right leg jerking, especially at night. Additionally, he is having increased weakness in his right leg that has caused multiple falls. His AFO brace helps some. He is having issues with strength and fine motor skills in his right hand by the end of the day. Overall, says he does not remember a time that he just felt well.      HISTORICAL Timothy Love is a 59 years old right-handed Caucasian male, referred by his primary care physician Dr. Quincy Simmonds, and orthopedic surgeon Dr. Volanda Napoleon for evaluation of transverse myelitis  He has a previous history of hyperlipidemia, works as Clinical biochemist, at the end of April 2013, he began to notice numbness, initially at his feet, over 2 weeks course, it ascend to his mid chest, in the meantime he felt mild lower extremity weakness, he was able to continue his full-time job as Clinical biochemist, but he had gait difficulty, at its peak, between May and July 2013, he also had a urinary and bowel incontinence,  He was evaluated by local orthopedic clinic Dr. Volanda Napoleon, MRI of thoracic spine has demonstrate right upper thoracic spine, and the left lower thoracic spine T2 lesions, expending 1 to 2 segments  MRI of the brain with and without contrast in [facility] demonstrated right frontal T2 lesion, left coronal radiata lesions, there was one right parieto-occipital lobe with thin linear enhancing lesions.  He was not offered any treatment, overall he has much improved, now ambulates with mild unsteadiness, still has numbness at mid chest level, no incontinence, he also has occasional shooting numbness to his bilateral upper extremity, Lhermitte signs when he bends down his neck  I have reviewed the MRI film from [place] in 2013, MRI  cervical had patchy area of abnormality, at C2, 3, 4, 5, 6, T1-2.  CSF Nov 2013, more than 5 oligoclonal banding, WBC 9, above findings support definite diagnosis of relapsing remitting multiple sclerosis, he continued to have Lhermitt signs, ambulate without difficulty, no visual loss,   Visual evoked potential was abnormal, secondary to a prolonged P100 latency on the left. 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   UPDATE April 11 th 2014,  [name] was started in Jan 15th 2014, his insurance will not cover [name], overall he is doing well, he took the medicine Monday Wednesday Friday evening, proceeding with ibuprofen, he complains of mild worsening depression, he also has bilateral lower extremity increased tghtness. He still works full-time as Clinical biochemist, performing 95% off work capacity, avoiding high ladder climbing   UPDATE Feb 27th 2015: He was changed to [place] since Nov 2014, mild nause, flushing, after he had a repeat MRI brain and spine in Oct 2014, he had a second opinion at [place] [facility], by Dorann Lodge He is clinically stable, but on the repeat MRI of the brain, cervical, and thoracic spine, there was progression of disease, this was done in October 2014 at [facility]   MRI thoracic spine, Atrophic appearing thoracic cord with multiple T2 hyperintense lesions consistent with demyelinating plaques. No lesions demonstrate enhancement to indicate active demyelination.Compression deformity of the T10 vertebral body with edema and  enhancement in the superior endplate, indicating that this is acute/subacute in nature.most notably at the T7-8 level appear larger than on the examination from 08/09/11.In addition, the T10 compression deformity described on the current study is new, and was not present on the 08/09/11 study  MRI cervical spine,  the brainstem lesions described on the present examination from 01/03/13 appear new compared to the 02/19/12 study.Multiple lesions throughout the cervical cord appear overall unchanged. multiple T2 hyperintense plaques throughout the cervical cord and proximal brainstem most notably the craniocervical junction and throughout the cervical cord extending from approximately C2-C6. No enhancement is seen. There is mild multilevel uncovertebral and facet hypertrophy, but without significant canal or foraminal stenosis.  MRI of brain, Multiple T2 hyperintense lesions throughout the brainstem and cervical cord consistent with multiple sclerosis. No enhancement to suggest active demyelination.There is definitely a new lesion in the left periventricular white matter. There are also likely several new punctate lesions adjacent to the anterior horn of the cheeks and right lateral ventricle, however this may be exaggerated given differences in slice thickness between the 2 studies. The infratentorial lesions are not definitively seen on the prior study, however this may again be due to to technical factors. He still works full times, at times, he complains of fatigue, legs gave up, hands does not cooperative, finger tips and feet paresthesia, gets tired, falling off ladders, loose balance, No significant bowel and bladder issues, urgerncy.  UPDATE May 8th 2015:  He had MRI at [place], [place], we have reviewed MRI thoracic spine, persistent signal alteration within the cord, in a similar distribution as the prior examination. The overall size of the cord however is not as prominent suggesting possible mild improvement. No new signal alteration is evident, and there is no  abnormal region of enhancement on the postcontrast portion of the examination. Minimal degenerative disc disease without significant spinal or  foraminal stenosis at any level.  MRI cervical spine: With and without contrast, persistent signal  alteration within the cervical cord, in a distribution similar to previous examination, the overall size of the cortex is not as prominent, suggesting possible mild improvement, no new abnormal signals, no contrast enhancement.  MRI of the brain without contrast report, that he was stable white matter disease, in comparison to August 2013, the distribution of consistent with multiple sclerosis, no contrast enhancement, 5 mm enhancing focus along the undersurface of right frontal lobe, suggesting a tiny meningioma, next lef  clinically patient is stable as well, his fatigue has much improved with ranitidine 5 mg twice a day, no significant gait difficulty, but occasionally bilateral lower extremity muscle spasm, jumpy movement, no visual change   UPDATE Dec 4th 2015: He is dong well, a lot better than 2014, he does not have those frequent episode of left jerking, ritalin 5 mg twice a day he has been very helpful, he was able to work full time  He has mild flushing with [name] treatment.  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  MRI of brain: there is no  restricted diffusion to suggest an acute cortical or white matter infarct. The scattered foci of high signal within the subcortical and periventricular white matter are stable. There is no new focus of abnormal signal. There is no significant atrophy, or ventriculomegaly.  muscle spasticity. Overall he feels that things have remained stable. He returns today for an  evaluation.  Update February 12th 2018: He has no clear significant clinical flareup, but over the past few years, he has gradual worsening spastic gait, especially the right leg, he tends to drag his right leg, also mild right and arm weakness and numbness  We have personally reviewed MRI with and without contrast in 2017, MRI of the brain mild supratentorium lesions contrast enhancement MRI of cervical and thoracic spine: Patchy area of cord hyperintensity, no contrast enhancement MRI of lumbar: Multilevel degenerative disc disease, no canal stenosis, mild foraminal stenosis at different levels  EMG nerve conduction study in February 2018 showed no significant abnormality, no evidence of lumbar stenosis.  He complains of fatigue, taking Ritalin 5 mg twice a day, bilateral lower extremity spasticity taking baclofen 10 mg 3 times a day, gabapentin 300 mg 2 tablets 3 times a day  Has been on [name] treatment, tolerating it well.  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  UPDATE September 12 2016: He continue complains of mild gait abnormality, dragging his right leg, difficulty climbing up steps, he is hesitate to start more aggressive treatment for his Relapsing Remitting Multiple Sclerosis, concern of potential side effect, we went over the potential side effect of Ocrelizumab in detail  Extensive laboratory evaluations in March 2018, showed negative QuantiFERON antibody, hepatitis B surface antigen, positive VZV IgG antibody, normal TSH, CMP,  UPDATE Feb 01 2017: He noticed more gait abnormality, unsteady, right leg is getting weaker, he has radiating pain to right leg, he contributed his right anterior thigh paresthesia from his previous right hernia surgery. He also has chronic low back pain, MRI Lumbar showed degenerative changes. EMG nerve conduction in February 2018 was essentially normal  MRI of the brain in June 2018 showed mild periventricular subcortical white  matter disease, no contrast enhancement. MRI of cervical spine and thoracic spine with without contrast showed diffuse signal throughout the cervical cord, and scattered subtle foci of high signal intensity within the thoracic spine, no contrast enhancement,  He also complains of intermittent episode of urinary and bowel incontinence,  UPDATE May 08 2017: He got ocrevus infusion Dec 3, and Dec 18th, 2018 following 1st infusion, he had a transient diffuse body achy pain, but did very well with his second infusion. He has mild weakness of right leg, right hip bursitis, right hernia surgery in the past, noticed some irritation of right groin area, shooting discomfort to right leg.  He also complains of  right leg jerking movement.  He fell in Jan 2019, due to right leg weakness.  Gradual worsening increased gait abnormality  UPDATE November 28 2017: He had his 2nd Ocrevus in June 2019, he tolerated very well, he has pain at both shoulders.  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  We personally reviewed repeat MRI of the brain in December 2019 from [place], there was no significant changes, only mild lesion load,  UPDATE Aug 25 2019: He is accompanied by his wife at today's clinical visit, he reported slow decline over the past few years, right leg more than left leg weakness, also complains of right hand clumsiness, has worsening gait abnormality, also urinary bowel urgency, occasionally incontinence, he complains of depression, poor sleep  quality, diffuse body achy pain despite polypharmacy treatment, including Cymbalta 60 mg, Prozac 40 mg, also gabapentin up to 600 mg 3 times a day,  He continues to work full-time as Personnel officer, noticed increased difficulty handling his job,  We personally reviewed [facility] for MRIs done 03/21/2019.  MRI of the thoracic spine was stable, scattered foci of signal alteration within the cord are relatively stable compared to prior examination.  There is no abnormal region of enhancement or enhancing lesion on the postcontrast portion of the exam.  The chronic compression of T7 and T10 is again noted, stable in degree of compression.  There is no retropulsion.  The degenerative disc disease is very mild diffusely, without significant spinal or foraminal stenosis at any level.  MRI of cervical spine, the signal alteration within the cord has progressed mildly compared to the prior examination.  There is no enhancement on the postcontrast portion of the exam.  The degenerative disc disease is quite mild.   REVIEW OF SYSTEMS: Full 14 system review of systems performed and notable only for as above All other review of systems were negative.  ALLERGIES: Allergies  Allergen Reactions  . Penicillins Other (See Comments)    Child-doesn't know    HOME MEDICATIONS: Current Outpatient Medications  Medication Sig Dispense Refill  . baclofen (LIORESAL) 10 MG tablet Take 1 tablet (10 mg total) by mouth 3 (three) times daily. 270 each 4  . Cholecalciferol (VITAMIN D PO) Take 2,000 Units by mouth daily.     . clonazePAM (KLONOPIN) 0.5 MG tablet Take 1 tablet (0.5 mg total) by mouth at bedtime as needed for anxiety. 30 tablet 5  . DULoxetine (CYMBALTA) 60 MG capsule Take 1 capsule by mouth once daily 90 capsule 3  . FLUoxetine (PROZAC) 40 MG capsule 40 mg.    . modafinil (PROVIGIL) 200 MG tablet Take 1 tablet (200 mg total) by mouth daily. 90 tablet 3  . ocrelizumab 600 mg in sodium  chloride 0.9 % 500 mL Inject 600 mg into the vein every 6 (six) months. Initial infusions: 03/05/17, 03/20/17    . pravastatin (PRAVACHOL) 10 MG tablet Take 10 mg by mouth daily.    Marland Kitchen oxybutynin (DITROPAN-XL) 5 MG 24 hr tablet Take 1 tablet (5 mg total) by mouth at bedtime. 30 tablet 11  . pregabalin (LYRICA) 150 MG capsule Take 1 capsule (150 mg total) by mouth in the morning, at noon, and at bedtime. 270 capsule 4   No current facility-administered medications for this visit.    PAST MEDICAL HISTORY: Past Medical History:  Diagnosis Date  . Anxiety   . Depression   . Kidney stone   . MS (multiple sclerosis) (HCC)   . Unspecified cause of encephalitis, myelitis, and encephalomyelitis     PAST SURGICAL HISTORY: History reviewed. No pertinent surgical history.  FAMILY HISTORY: Family History  Problem Relation Age of Onset  . High Cholesterol Mother   . Heart Problems Mother   . Heart  attack Father     SOCIAL HISTORY: Social History   Socioeconomic History  . Marital status: Married    Spouse name: cynthia  . Number of children: 2  . Years of education: college  . Highest education level: Not on file  Occupational History    Employer: [employer]  Tobacco Use  . Smoking status: Never Smoker  . Smokeless tobacco: Never Used  Substance and Sexual Activity  . Alcohol use: No    Alcohol/week: 0.0 standard drinks  . Drug use: No  . Sexual activity: Not on file  Other Topics Concern  . Not on file  Social History Narrative  . Not on file   Social Determinants of Health   Financial Resource Strain:   . Difficulty of Paying Living Expenses:   Food Insecurity:   . Worried About Programme researcher, broadcasting/film/video in the Last Year:   . Barista in the Last Year:   Transportation Needs:   . Freight forwarder (Medical):   Marland Kitchen Lack of Transportation (Non-Medical):   Physical Activity:   . Days of Exercise per Week:   . Minutes of Exercise per Session:     Stress:   . Feeling of Stress :   Social Connections:   . Frequency of Communication with Friends and Family:   . Frequency of Social Gatherings with Friends and Family:   . Attends Religious Services:   . Active Member of Clubs or Organizations:   . Attends Banker Meetings:   Marland Kitchen Marital Status:   Intimate Partner Violence:   . Fear of Current or Ex-Partner:   . Emotionally Abused:   Marland Kitchen Physically Abused:   . Sexually Abused:      PHYSICAL EXAM   Vitals:   08/25/19 0743  BP: (!) 142/82  Pulse: 72  Weight: 228 lb (103.4 kg)  Height: 6' (1.829 m)    Not recorded      Body mass index is 30.92 kg/m.  PHYSICAL EXAMNIATION:  Gen: NAD, conversant, well nourised, well groomed                     Cardiovascular: Regular rate rhythm, no peripheral edema, warm, nontender. Eyes: Conjunctivae clear without exudates or hemorrhage Neck: Supple, no carotid bruits. Pulmonary: Clear to auscultation bilaterally   NEUROLOGICAL EXAM:  MENTAL STATUS: Speech:    Speech is normal; fluent and spontaneous with normal comprehension.  Cognition:     Orientation to time, place and person     Normal recent and remote memory     Normal Attention span and concentration     Normal Language, naming, repeating,spontaneous speech     Fund of knowledge   CRANIAL NERVES: CN II: Visual fields are full to confrontation. Pupils are round equal and briskly reactive to light. CN III, IV, VI: extraocular movement are normal. No ptosis. CN V: Facial sensation is intact to light touch CN VII: Face is symmetric with normal eye closure  CN VIII: Hearing is normal to causal conversation. CN IX, X: Phonation is normal. CN XI: Head turning and shoulder shrug are intact  MOTOR: Mild spasticity of right upper extremity, fixation on rapid rotating movement, mild clumsiness and weakness of right hand grip, moderate right more than left leg spasticity, he could not reach right hip against  gravity, mild right knee flexion, knee extension weakness, moderate right ankle dorsiflexion 3- out of 5, right ankle plantarflexion 3 out of 5  REFLEXES: Hyperreflexia of right upper and lower extremity,   SENSORY: Intact to light touch,   COORDINATION: There is no trunk or limb dysmetria noted.  GAIT/STANCE: He needs push-up to get up from seated position, dragging his right leg, right foot drop, holding his right arm in elbow flexion, wrist flexion   DIAGNOSTIC DATA (LABS, IMAGING, TESTING) - I reviewed patient records, labs, notes, testing and imaging myself where available.   ASSESSMENT AND PLAN  Mikhi Athey is a 59 y.o. 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,  There was significant lesion noted at cervical, thoracic spine, mild lesion noted in MRI of the brain,  Most recent MRI of the cervical and thoracic spine was in December 2020, most recent MRI of the brain was in December 2019  He was treated with [name] January-November 2014, had worsening depression, [name] November 2014 to November 2018  Started Ocrevus since December 2018   Patient still works as a Psychologist, sport and exercise, despite gait abnormality, he can walk 100 m without help or stopping  Patient is concerned about his slow declining, we will visit [employer], we have discussed other options including [facility], Julaine Hua, he will review information, I do not see significant advantage of switch over therapy at this point  Gait abnormality, spasticity of bilateral upper extremity, right hand clumsiness,  Continue baclofen 10 mg 3 times a day,   suboptimal control of body achy pain with gabapentin up to 600 mg 3 times a day, will switch to Lyrica 153 times a day  EMG guided Xeomin in May 2018  made no significant difference  Fatigue  Refilled Provigil 200 mg daily  Worsening depression   Cymbalta 60 mg daily  Also receiving Prozac 40 mg from his primary care physician Worsening bowel and bladder incontinence  Emphasized importance of regular regime  Add on oxybutynin XR 5 mg daily  Total time spent reviewing the chart, obtaining history, examined patient, ordering tests, documentation, consultations and family, care coordination was 70 minutes    Levert Feinstein, M.D. Ph.D.  [facility] [address] [place], [place] [address] Ph: [phone] Fax: [phone]

## 2019-08-25 NOTE — Patient Instructions (Signed)
Timothy Love  [facility]

## 2019-09-04 ENCOUNTER — Telehealth: Payer: Self-pay | Admitting: *Deleted

## 2019-09-04 NOTE — Telephone Encounter (Signed)
Appeal for Timothy Love has been approved through 08/22/2020. [id number]. He has coverage through Timothy Love [phone]). This information will be provided to [facility].

## 2019-09-08 NOTE — Telephone Encounter (Signed)
Approval valid through 08/06/2020.

## 2019-12-09 ENCOUNTER — Telehealth: Payer: Self-pay | Admitting: *Deleted

## 2019-12-09 ENCOUNTER — Other Ambulatory Visit: Payer: Self-pay | Admitting: *Deleted

## 2019-12-09 MED ORDER — TOLTERODINE TARTRATE ER 4 MG PO CP24: 4.0000 mg | ORAL_CAPSULE | Freq: Every day | ORAL | 4 refills | Status: DC

## 2019-12-09 NOTE — Addendum Note (Signed)
Addended by: Levert Feinstein on: 12/09/2019 01:23 PM   Modules accepted: Orders

## 2019-12-09 NOTE — Telephone Encounter (Signed)
Meds ordered this encounter  Medications  . tolterodine (DETROL LA) 4 MG 24 hr capsule    Sig: Take 1 capsule (4 mg total) by mouth daily.    Dispense:  90 capsule    Refill:  4

## 2019-12-09 NOTE — Telephone Encounter (Signed)
The patient is currently taking oxybutynin ER 5mg , one tablet at bedtime.  Received a faxed notification from his new pharmacy coverage that the medication is not covered. [facility] - ph: [phone], fax: [phone].  The pharmacy provided tolterodine 1mg  as alternative treatment. If appropriate, a new rx will need to be sent to [facility] for 90-day mail order supply.

## 2019-12-09 NOTE — Addendum Note (Signed)
Addended by: Lindell Spar C on: 12/09/2019 02:56 PM   Modules accepted: Orders

## 2019-12-09 NOTE — Telephone Encounter (Addendum)
I called and left the patient a detailed message (ok per DPR) letting him know tolterodine has been sent in to replace oxybutynin due to his insurance formulary changes. I provided our number to call back with any questions.  I also left this message on his wife's voicemail.

## 2019-12-10 ENCOUNTER — Other Ambulatory Visit: Payer: Self-pay | Admitting: Neurology

## 2019-12-16 ENCOUNTER — Other Ambulatory Visit: Payer: Self-pay | Admitting: Neurology

## 2019-12-16 MED ORDER — PREGABALIN 150 MG PO CAPS: ORAL_CAPSULE | ORAL | 0 refills | Status: DC

## 2019-12-16 NOTE — Telephone Encounter (Signed)
Pt is asking if 3 days(9 pills) worth of his pregabalin (LYRICA) 150 MG capsule can be called into to [id number] while he waits on his mail order, please call.

## 2019-12-16 NOTE — Addendum Note (Signed)
Addended by: Lindell Spar C on: 12/16/2019 02:23 PM   Modules accepted: Orders

## 2019-12-16 NOTE — Telephone Encounter (Signed)
Rx sent to local pharmacy to prevent disruption to treatment.

## 2019-12-31 ENCOUNTER — Telehealth: Payer: Self-pay | Admitting: Neurology

## 2019-12-31 NOTE — Telephone Encounter (Signed)
Pt called and LVM stating that his PCP stated that she is recommending a Editor, commissioning but asked him to ask his neurologist to make sure if it is ok for him to get one. Please advise.

## 2019-12-31 NOTE — Telephone Encounter (Signed)
I spoke to the patient. He would like to get a type of TENS unit used on the heel to see if it will help with his right foot drop. He discussed this with his PCP and she wanted him to clear it with Dr. Terrace Arabia.

## 2019-12-31 NOTE — Telephone Encounter (Signed)
Per Dr. Terrace Arabia, it is okay for him to try this treatment. I have called the patient back to notify him.

## 2020-02-03 ENCOUNTER — Telehealth: Payer: Self-pay | Admitting: Neurology

## 2020-02-03 DIAGNOSIS — G35 Multiple sclerosis: Secondary | ICD-10-CM

## 2020-02-03 NOTE — Telephone Encounter (Signed)
I spoke to the patient and he is agreeable to see [name] for a second opinion concerning his MS. He has been scheduled for this consultation on 02/23/20 at 9am. He will check in at 8:30am. He was appreciative for the referral.

## 2020-02-03 NOTE — Addendum Note (Signed)
Addended by: Levert Feinstein on: 02/03/2020 04:57 PM   Modules accepted: Orders

## 2020-02-03 NOTE — Telephone Encounter (Signed)
Pt called, Pt want a second opinion due to questions on medication that ws prescribe by Dr. Terrace Arabia. Pt would like to see Dr. Lucia Gaskins for second opinion. Would like a call from the nurse.

## 2020-02-03 NOTE — Telephone Encounter (Signed)
OK to see Dr. Lucia Gaskins,  Better to see [name] for MS related questions.

## 2020-02-03 NOTE — Telephone Encounter (Signed)
I have put in referral to [name],  our MS specialist for second opinion of MS related question, please let patients know,

## 2020-02-03 NOTE — Telephone Encounter (Signed)
If the second opinion is in regards to MS I could see him

## 2020-02-04 NOTE — Telephone Encounter (Signed)
Thank you, I agree patient is not appropriate for me thanks

## 2020-02-23 ENCOUNTER — Telehealth: Payer: Self-pay | Admitting: *Deleted

## 2020-02-23 ENCOUNTER — Encounter: Payer: Self-pay | Admitting: Neurology

## 2020-02-23 ENCOUNTER — Ambulatory Visit: Payer: BC Managed Care – PPO | Admitting: Neurology

## 2020-02-23 VITALS — BP 165/94 | HR 67 | Ht 72.0 in | Wt 227.0 lb

## 2020-02-23 DIAGNOSIS — R0683 Snoring: Secondary | ICD-10-CM

## 2020-02-23 DIAGNOSIS — Z7960 Long term (current) use of unspecified immunomodulators and immunosuppressants: Secondary | ICD-10-CM

## 2020-02-23 DIAGNOSIS — G35 Multiple sclerosis: Secondary | ICD-10-CM

## 2020-02-23 DIAGNOSIS — Z79899 Other long term (current) drug therapy: Secondary | ICD-10-CM

## 2020-02-23 DIAGNOSIS — E559 Vitamin D deficiency, unspecified: Secondary | ICD-10-CM

## 2020-02-23 DIAGNOSIS — Z5181 Encounter for therapeutic drug level monitoring: Secondary | ICD-10-CM

## 2020-02-23 DIAGNOSIS — G35D Multiple sclerosis, unspecified: Secondary | ICD-10-CM

## 2020-02-23 DIAGNOSIS — G4719 Other hypersomnia: Secondary | ICD-10-CM

## 2020-02-23 MED ORDER — BACLOFEN 10 MG PO TABS: 10.0000 mg | ORAL_TABLET | Freq: Four times a day (QID) | ORAL | 4 refills | Status: DC

## 2020-02-23 MED ORDER — GABAPENTIN 600 MG PO TABS
600.0000 mg | ORAL_TABLET | Freq: Three times a day (TID) | ORAL | 3 refills | Status: DC
Start: 2020-02-23 — End: 2020-10-05

## 2020-02-23 NOTE — Progress Notes (Addendum)
[facility]  PATIENT: Timothy Love DOB: September 18, 1960  REFERRING DOCTOR OR PCP: Levert Feinstein MD, PhD SOURCE: Patient, notes from Dr. Terrace Arabia, imaging and lab reports, MRI images personally reviewed.  _________________________________   HISTORICAL  CHIEF COMPLAINT:  Chief Complaint  Patient presents with  . New Patient (Initial Visit)    RM 13, with wife. Transferring care from Dr. Terrace Arabia. Saw her on 08/25/2019. Ambulates with cane. Most recent fall this past Sat. Falls a lot per wife Denies hitting head.   . Multiple Sclerosis    On Ocrevus. Last: 09/29/19, next: 04/20/20. Receives at [facility] with intrafusion. Walking has worsened per pt.   . Fatigue    Takes provigil   . Depression    Takes cymbalta   . Spasms    Takes baclofen. Has spasms in right leg. Has decreased strength in right hand.  . Insomnia    Unable to stay asleep. Feels this started about 2 or more years ago.     HISTORY OF PRESENT ILLNESS:  I had the pleasure of seeing your patient, Timothy Love, at the [facility] at [facility] for neurologic consultation regarding his MS and treatment options.  He is a 59 year old man who was diagnosed with MS in 2013.  In April 2013 he noticed numbness that started in the feet and ascended to the waist line.   He also had pain in the thighs at the onset. Marland Kitchen  He also had mild weakness in the legs.  He had difficulty with his gait and also had urinary and bowel incontinence 20 2013.  He saw orthopedics and had MRI of the thoracic spine showing T2 lesions.  MRI of the brain in 2013 showed multiple T2/flair hyperintense foci including 1 in the right parieto-occipital lobe that had enhancement.  Initially, he was placed on the drip.  He was on [name] from January 2014 through 02/20/2013 and then switched to [facility] November 2014.  He continued on [name] until 02/20/2017 when he was switched to Ocrevus.   At the time, his right leg was doing worse though there  was no definite exacerbation.  He has tolerated Ocrevus but is wondering about different therapeutic options.  Specifically, he would like to discuss Kesimpta.  Currently, his gait is worse due mostly to right leg weakness and spasticity.    He has also noted more difficulty witrh fine motor and gross motor use of the right hand.   He has spasms that occur at night in the right leg.  Laying on his right side is better than other positions but he rolls over while asleep and wakes up with spasms.  He takes baclofen 10 mg po tid.    He has some dysesthesias, helped by gabapentin.  He switched to Lyrica early 2020 and feel it is no better.   He is sleepy durig the day.   He snores loudly at night and his wife sleeps in a different room.  She does not note OSA signs.    He has gained weight the last few years (since starting Lyrica).  He is eating more as he is less active at work and around the house.    He has had some depression.  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  Due to reduced mobility, he was already limited to not use ladders or climbing more than a few feet.  He is right-hand dominant and the right hand has reduced fine motor control and reduced ability to do rapid alternating movements.  There is reduced strength in the right arm and leg.  He gets spasms when he sits for long time of the right leg.  Additionally, he fatigues easily.Marland Kitchen  He had Covid-21 December 2018.    He felt very tired a few days but had no severe issues.      Imaging review I  personally reviewed the MRI of the brain from 10/01/2015.  It shows multiple T2/FLAIR hyperintense foci scattered through the periventricular, juxtacortical and deep white matter.  None of the foci appear to be acute.  MRI of the cervical spine 10/08/2015 showed foci to the right at C3, centrally at C4, anterolaterally to the left at C4-C5 and to the right at C5-C6.  MRI of the thoracic spine 10/08/2015 showed a focus to the right at T1-T2 a larger focus to the left at T7-T8 and a central focus at T9.  Additionally, there are endplate chronic fractures of T7 and T10.  Laboratory review: CSF November 2013 showed more than 5 oligoclonal bands.  Visual evoked potential showed prolonged P100 latencies on the left consistent with history of left optic neuritis.  REVIEW OF SYSTEMS: Constitutional: No fevers, chills, sweats, or change in appetite.    He has fatigue and sleepiness.   Eyes: No visual changes, double vision, eye pain Ear, nose and throat: No hearing loss, ear pain, nasal congestion, sore throat Cardiovascular: No chest pain, palpitations Respiratory: No shortness of breath at rest or with exertion.   No wheezes.  He snores. GastrointestinaI: No nausea, vomiting, diarrhea, abdominal pain, fecal incontinence Genitourinary: No dysuria, urinary retention or frequency.  No nocturia. Musculoskeletal: No neck pain, back pain.  Pain in the right leg from spasticity Integumentary: No rash, pruritus, skin lesions Neurological: as above Psychiatric: No depression at this time.  No anxiety Endocrine: No palpitations, diaphoresis, change in appetite, change in weigh or increased thirst Hematologic/Lymphatic: No anemia, purpura, petechiae. Allergic/Immunologic: No itchy/runny eyes, nasal congestion, recent allergic reactions, rashes  ALLERGIES: Allergies  Allergen Reactions  . Penicillins Other (See Comments)    Child-doesn't know    HOME MEDICATIONS:  Current Outpatient Medications:  .   baclofen (LIORESAL) 10 MG tablet, Take 1 tablet (10 mg total) by mouth 3 (three) times daily., Disp: 270 each, Rfl: 4 .  Cholecalciferol (VITAMIN D PO), Take 2,000 Units by mouth daily. , Disp: , Rfl:  .  clonazePAM (KLONOPIN) 0.5 MG tablet, Take 1 tablet (0.5 mg total) by mouth at bedtime as needed for anxiety., Disp: 30 tablet, Rfl: 5 .  DULoxetine (CYMBALTA) 60 MG capsule, Take 1 capsule by mouth once daily, Disp: 90 capsule, Rfl: 3 .  FLUoxetine (PROZAC) 40 MG capsule, 40 mg., Disp: , Rfl:  .  modafinil (PROVIGIL) 200 MG tablet, Take 1 tablet (200 mg total) by mouth daily., Disp: 90 tablet, Rfl: 3 .  ocrelizumab 600 mg in sodium chloride 0.9 % 500 mL, Inject 600 mg  into the vein every 6 (six) months. Initial infusions: 03/05/17, 03/20/17, Disp: , Rfl:  .  pravastatin (PRAVACHOL) 10 MG tablet, Take 10 mg by mouth daily., Disp: , Rfl:  .  pregabalin (LYRICA) 150 MG capsule, Take 1 capsule (150 mg total) by mouth in the morning, at noon, and at bedtime., Disp: 270 capsule, Rfl: 4 .  pregabalin (LYRICA) 150 MG capsule, Take one capsule in morning, one capsule at noon, one capsule at bedtime., Disp: 9 capsule, Rfl: 0 .  tolterodine (DETROL) 2 MG tablet, Take 1 tablet (2 mg total) by mouth 2 (two) times daily., Disp: 180 tablet, Rfl: 3  PAST MEDICAL HISTORY: Past Medical History:  Diagnosis Date  . Anxiety   . Depression   . Kidney stone   . MS (multiple sclerosis) (HCC)   . Unspecified cause of encephalitis, myelitis, and encephalomyelitis     PAST SURGICAL HISTORY: No past surgical history on file.  FAMILY HISTORY: Family History  Problem Relation Age of Onset  . High Cholesterol Mother   . Heart Problems Mother   . Heart attack Father     SOCIAL HISTORY:  Social History   Socioeconomic History  . Marital status: Married    Spouse name: cynthia  . Number of children: 2  . Years of education: college  . Highest education level: Not on file  Occupational History    Employer:  [employer]  Tobacco Use  . Smoking status: Never Smoker  . Smokeless tobacco: Never Used  Substance and Sexual Activity  . Alcohol use: No    Alcohol/week: 0.0 standard drinks  . Drug use: No  . Sexual activity: Not on file  Other Topics Concern  . Not on file  Social History Narrative  . Not on file   Social Determinants of Health   Financial Resource Strain:   . Difficulty of Paying Living Expenses: Not on file  Food Insecurity:   . Worried About Programme researcher, broadcasting/film/video in the Last Year: Not on file  . Ran Out of Food in the Last Year: Not on file  Transportation Needs:   . Lack of Transportation (Medical): Not on file  . Lack of Transportation (Non-Medical): Not on file  Physical Activity:   . Days of Exercise per Week: Not on file  . Minutes of Exercise per Session: Not on file  Stress:   . Feeling of Stress : Not on file  Social Connections:   . Frequency of Communication with Friends and Family: Not on file  . Frequency of Social Gatherings with Friends and Family: Not on file  . Attends Religious Services: Not on file  . Active Member of Clubs or Organizations: Not on file  . Attends Banker Meetings: Not on file  . Marital Status: Not on file  Intimate Partner Violence:   . Fear of Current or Ex-Partner: Not on file  . Emotionally Abused: Not on file  . Physically Abused: Not on file  . Sexually Abused: Not on file     PHYSICAL EXAM  Vitals:   02/23/20 0839  BP: (!) 165/94  Pulse: 67  Weight: 227 lb (103 kg)  Height: 6' (1.829 m)    Body mass index is 30.79 kg/m.   General: The patient is well-developed and well-nourished and in no acute distress  HEENT:  Head is [place]/AT.  Sclera are anicteric.  Funduscopic exam shows normal optic discs and retinal vessels.  Neck: No carotid bruits are noted.  The neck is nontender.  Cardiovascular: The heart has a regular rate and rhythm with a normal S1 and S2. There were no murmurs,  gallops or rubs.    Skin: Extremities are without rash or  edema.  Musculoskeletal:  Back is nontender  Neurologic Exam  Mental status: The patient is alert and oriented x 3 at the time of the examination. The patient has apparent normal recent and remote memory, with an apparently normal attention span and concentration ability.   Speech is normal.  Cranial nerves: Extraocular movements are full. Pupils are equal, round, and reactive to light and accomodation.  Visual fields are full.  Facial symmetry is present. There is good facial sensation to soft touch bilaterally.Facial strength is normal.  Trapezius and sternocleidomastoid strength is normal. No dysarthria is noted.  The tongue is midline, and the patient has symmetric elevation of the soft palate. No obvious hearing deficits are noted.  Motor:  Muscle bulk is normal.   Tone is increased on the right, leg more than arm.. Strength is 4+to 5/5 in the right arm and hand.  He has reduced rapid altering movements in the hand.  Strength is 4 to 4+/5 in the right leg  Sensory: He has reduced vibration sensation in the right leg.  Normal touch sensation.  Coordination: He has reduced finger-nose-finger in the right arm.  He cannot do heel-to-shin with the right leg.  Gait and station: Station is fairly normal though he needs to use his hands to stand up from the chair.   He has a spastic gait with a reduced stride.  There is a right foot drop.  He is unable to do a tandem walk.  Romberg is negative.   Reflexes: Deep tendon reflexes are asymmetrically increased in the legs, with crossed abductor responses at the right knee.  Reflexes were more symmetric in the arm.   Plantar responses are flexor.    DIAGNOSTIC DATA (LABS, IMAGING, TESTING) - I reviewed patient records, labs, notes, testing and imaging myself where available.  Lab Results  Component Value Date   WBC 5.7 03/17/2019   HGB 14.3 03/17/2019   HCT 44.1 03/17/2019   MCV 89  03/17/2019   PLT 292 03/17/2019      Component Value Date/Time   NA 140 03/17/2019 1407   K 4.3 03/17/2019 1407   CL 105 03/17/2019 1407   CO2 19 (L) 03/17/2019 1407   GLUCOSE 165 (H) 03/17/2019 1407   BUN 22 03/17/2019 1407   CREATININE 0.87 03/17/2019 1407   CALCIUM 9.5 03/17/2019 1407   PROT 6.3 03/17/2019 1407   ALBUMIN 4.3 03/17/2019 1407   AST 20 03/17/2019 1407   ALT 23 03/17/2019 1407   ALKPHOS 123 (H) 03/17/2019 1407   BILITOT <0.2 03/17/2019 1407   GFRNONAA 95 03/17/2019 1407   GFRAA 110 03/17/2019 1407   No results found for: CHOL, HDL, LDLCALC, LDLDIRECT, TRIG, CHOLHDL No results found for: IPJA2N Lab Results  Component Value Date   VITAMINB12 201 (L) 02/01/2017   Lab Results  Component Value Date   TSH 0.950 11/28/2017       ASSESSMENT AND PLAN  MS (multiple sclerosis) (HCC) - Plan: SAR CoV2 Serology (COVID 19)AB(IGG)IA, IgG, IgA, IgM, CD19 and CD20, Flow Cytometry, Hepatitis B surface antibody,qualitative, Hepatitis B surface antigen, Hepatitis B core antibody, total  Encounter for monitoring immunomodulating therapy - Plan: SAR CoV2 Serology (COVID 19)AB(IGG)IA, IgG, IgA, IgM, CD19 and CD20, Flow Cytometry, Hepatitis B surface antibody,qualitative, Hepatitis B  surface antigen, Hepatitis B core antibody, total  Snoring - Plan: Home sleep test  Excessive daytime sleepiness - Plan: Home sleep test  Vitamin D deficiency - Plan: Vitamin D, 25-hydroxy    In summary, Mr. Leontine Locket is a 59 year old man who was diagnosed with MS in 2013 but likely had symptoms a few years previously.  Although he has had no definite exacerbation while on Ocrevus, he has had some slow worsening, especially with right leg weakness and spasticity.  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  We discussed treatment options.  He would prefer to be on a self injectable medication rather than Ocrevus.  We discussed Kesimpta and he would like to switch.  I  will check some lab work.  He has additional symptoms including worsening fatigue.  This is probably due to the MS.  Additionally has some sleepiness that may be due to sleep apnea as he also snores.  We will check a home sleep study and order AutoPap based on the results.  In the interim he should continue Provigil.  Because of the increased spasticity in the leg, I will increase his nighttime baclofen from 10 mg to 20 mg (will take 01-11-19 mg over the day).  He also has dysesthesias.  He has gained weight while on Lyrica and I will switch back to gabapentin at a dose of 6 mg p.o. 3 times daily.  He is having more difficulty at work.  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  Because of the reduced rapid altering movement and reduced fine motor control in the right hand, he would have difficulty doing jobs requiring keyboarding.  Furthermore, he has fatigue associated with the MS.  Therefore, I believe that he is disabled.  He will return to see me in 6 months or sooner for new or worsening neurologic symptoms.  70-minute office visit with the majority of the time spent face-to-face for history and physical, discussion/counseling and decision-making.  Additional time with extensive record review (including multiple MRIs) and documentation.   Blakely Maranan A. Epimenio Foot, MD, [facility] 02/23/2020, 9:15 AM Certified in Neurology, Clinical Neurophysiology, Sleep Medicine and Neuroimaging  [facility] [address] [place], [place] [address] [phone]

## 2020-02-23 NOTE — Telephone Encounter (Signed)
Received fax from [employer] talking about doing P2P before end of day if PA denied. I called to clarify. Spoke with Asher Muir. She states paper sent in case PA denied and we wanted to do P2P. PA still under review. Turn around time: 2-3 days.

## 2020-02-23 NOTE — Telephone Encounter (Signed)
Faxed completed/signed Kesimpta start form to [employer] at [phone]. Received fax confirmation.

## 2020-02-23 NOTE — Telephone Encounter (Signed)
Submitted PA Baclofen on CMM. Key: [id number]. Waiting on determination from [employer].

## 2020-02-24 ENCOUNTER — Telehealth: Payer: Self-pay | Admitting: Neurology

## 2020-02-24 NOTE — Telephone Encounter (Deleted)
PA Case: [id number], Status: Approved, Coverage Starts on: 02/24/2020 12:00:00 AM, Coverage Ends on: 02/23/2021 12:00:00 AM. °

## 2020-02-24 NOTE — Telephone Encounter (Signed)
Called [employer] and spoke with Ebbie Ridge, pharm tech. Advised [name] saw pt yesterday. Per OV note: "He has gained weight while on Lyrica and I will switch back to gabapentin at a dose of 6 mg p.o. 3 times daily." Advised them to d/c rx Lyrica. [name] sent in new rx yesterday for gabapentin.

## 2020-02-24 NOTE — Telephone Encounter (Signed)
Gesine from [employer] called to reverify quantity for Lyrica.  Best contact: [phone]  Ref. # [id number]

## 2020-02-25 ENCOUNTER — Ambulatory Visit: Payer: BC Managed Care – PPO | Admitting: Neurology

## 2020-02-25 NOTE — Telephone Encounter (Signed)
Spoke with Cindee Lame (Kesimpta rep) and Bennie Dallas, RN Hospital doctor) via email. They have confirmed start form has been received and being processed. Sent e-mail to [place] with approval info for [employer]. CMM [id number]:  PA Case: [id number], Status: Approved, Coverage Starts on: 02/25/2020 12:00:00 AM, Coverage Ends on: 02/24/2021 12:00:00 AM.  Informed intrafusion to cx Ocrevus infusions since pt will be changing to Kesimpta.

## 2020-02-25 NOTE — Addendum Note (Signed)
Addended by: Marcelina Morel L on: 02/25/2020 11:10 AM   Modules accepted: Orders

## 2020-02-25 NOTE — Telephone Encounter (Signed)
PA Case: [id number], Status: Approved, Coverage Starts on: 02/24/2020 12:00:00 AM, Coverage Ends on: 02/23/2021 12:00:00 AM.

## 2020-02-26 LAB — CD19 AND CD20, FLOW CYTOMETRY

## 2020-02-26 LAB — IGG, IGA, IGM
IgA/Immunoglobulin A, Serum: 121 mg/dL (ref 90–386)
IgG (Immunoglobin G), Serum: 698 mg/dL (ref 603–1613)
IgM (Immunoglobulin M), Srm: 53 mg/dL (ref 20–172)

## 2020-02-26 LAB — HEPATITIS B SURFACE ANTIGEN: Hepatitis B Surface Ag: NEGATIVE

## 2020-02-26 LAB — SAR COV2 SEROLOGY (COVID19)AB(IGG),IA
SARS-CoV-2 Semi-Quant IgG Ab: <13 AU/mL (ref <13.0)
SARS-CoV-2 Spike Ab Interp: NEGATIVE

## 2020-02-26 LAB — VITAMIN D 25 HYDROXY (VIT D DEFICIENCY, FRACTURES): Vit D, 25-Hydroxy: 24.2 ng/mL — ABNORMAL LOW (ref 30.0–100.0)

## 2020-02-26 LAB — HEPATITIS B SURFACE ANTIBODY,QUALITATIVE: Hep B Surface Ab, Qual: NONREACTIVE

## 2020-02-26 LAB — HEPATITIS B CORE ANTIBODY, TOTAL: Hep B Core Total Ab: NEGATIVE

## 2020-03-16 ENCOUNTER — Telehealth: Payer: Self-pay

## 2020-03-16 NOTE — Telephone Encounter (Signed)
Pt received an approval for his HST in the mail. He is wondering why he has not yet been contacted to schedule his HST.

## 2020-03-16 NOTE — Telephone Encounter (Signed)
I called pt. He has not received kesimpta or the welcome kit yet. I will reach out to our [employer] reps to check the status of this.

## 2020-03-17 NOTE — Telephone Encounter (Signed)
Pt hadn't been called because we haven't rec'd auth yet. Contacted insurance for that info. Pt is scheduled for Jan 5th, 2022.

## 2020-03-23 ENCOUNTER — Telehealth: Payer: Self-pay | Admitting: Neurology

## 2020-03-23 NOTE — Telephone Encounter (Signed)
[facility] Garret Reddish) called need clarification for the following; who provided the order (person only provided a initial. Would need full name provided), and clarification of quantity. Contact info: [phone].

## 2020-03-23 NOTE — Telephone Encounter (Signed)
Called back and spoke with Demetra. She transferred me to pharmacist, Caryn Bee. Confirmed he received fax with order for loading/maintenance dose of Kesimpta. Provided Dr. Bonnita Hollow NPI/address. They will get prescriptions ready, nothing further needed.

## 2020-03-29 NOTE — Telephone Encounter (Signed)
Received this notice from [employer]: "Patient Timothy Love DOB: 12/21/1960-- a new welcome kit was sent to him. Case has been successfully transferred to the [place] (it appears the SP did not have the correct NPI number). Our Case manager [facility]) will contact the SP on Wednesday for a dispense check."

## 2020-03-30 NOTE — Telephone Encounter (Signed)
Tried calling # back below but this was a fax #. We already completed PA and we have approval effective 02/25/20, unclear what is needed.

## 2020-03-30 NOTE — Telephone Encounter (Addendum)
Alisha from [place] [facility] called stating she will be faxing over paperwork for the pt's Kesimpta that she is needing filled out and sent back. Phone number to call back if any questions is [phone]

## 2020-03-30 NOTE — Telephone Encounter (Addendum)
Durenda Hurt states rep called from:  [phone]. I called this number and it was to [employer]. Spoke with Dahlia Client. They are not able to get a paid claim for the loading dose of Kesimpta. She transferred me to PA department. Spoke with [employer]. Submitted PA for loading dose over the phone. Loading dose: 20mg /0.61ml; sig: 1 SQ inj at week 0, 1, and 2, qty: 3 units. (3 week supply). 1.76ml/21 days. Plan limitation exceeded. She called senior resolution team to get cost override for loading dose. Spoke with Brittany/senior management team. She placed me on hold to reach out to [employer] to run test claim for loading dose. They will get medication into processing, Member will receive in the next day or two. Nothing further needed. Total time of call: 3m.

## 2020-04-05 ENCOUNTER — Telehealth: Payer: Self-pay | Admitting: Neurology

## 2020-04-05 NOTE — Telephone Encounter (Signed)
Rachael from [employer] called wanting to know if the pt's Hep B test came back negative. Please advise.

## 2020-04-05 NOTE — Telephone Encounter (Signed)
Called back. Spoke with Mikayla She transferred me to pharmacist, Koran. Advised Hep B labs negative, completed on 02/23/20. Nothing further needed. They updated pt chart.

## 2020-04-07 ENCOUNTER — Ambulatory Visit (INDEPENDENT_AMBULATORY_CARE_PROVIDER_SITE_OTHER): Payer: BC Managed Care – PPO | Admitting: Neurology

## 2020-04-07 DIAGNOSIS — G4733 Obstructive sleep apnea (adult) (pediatric): Secondary | ICD-10-CM | POA: Diagnosis not present

## 2020-04-07 DIAGNOSIS — G4719 Other hypersomnia: Secondary | ICD-10-CM

## 2020-04-07 DIAGNOSIS — R0683 Snoring: Secondary | ICD-10-CM

## 2020-04-08 ENCOUNTER — Telehealth: Payer: Self-pay | Admitting: Neurology

## 2020-04-08 DIAGNOSIS — G4733 Obstructive sleep apnea (adult) (pediatric): Secondary | ICD-10-CM | POA: Insufficient documentation

## 2020-04-08 DIAGNOSIS — G4719 Other hypersomnia: Secondary | ICD-10-CM | POA: Insufficient documentation

## 2020-04-08 DIAGNOSIS — R0683 Snoring: Secondary | ICD-10-CM | POA: Insufficient documentation

## 2020-04-08 NOTE — Telephone Encounter (Signed)
Called pt back. He should be receiving Kesimpta in the mail today. He will contact his case manager with Kesimpta to go over first injection training. He will call back if he has any further questions/concerns or does not receive medication in the mail.

## 2020-04-08 NOTE — Progress Notes (Signed)
   [facility]  HOME SLEEP STUDY  STUDY DATE: 04/07/2020 PATIENT NAME: Timothy Love DOB: Jun 06, 1960 MRN: [id number]  ORDERING CLINICIAN: Azariah Bonura A. Epimenio Foot, MD, PhD REFERRING CLINICIAN: Dixon Luczak A. Millissa Deese, MD. PhD   CLINICAL INFORMATION: 60 year old man with obstructive sleep apnea  FINDINGS:  Total Record Time: 7 hours 25 minutes Total Sleep Time:  6 hours 29 minutes via  Percent REM:   11.9%   Calculated pAHI:  57.9       REM pAHI:    55.3     NREM pAHI: 58.4  Pulse Mean:    62  pulse Range (N/A-102)    Oxygen Sat% Mean: 94  O2Sat Range (76-99%)  O2Sat <88%: 9.4 minutes    IMPRESSION:  This home sleep study shows severe obstructive sleep apnea with an AHI equals 57.9.   RECOMMENDATION: Due to the severity of the OSA, treatment with CPAP is recommended.  Consider either AutoPap or bring into the lab for a titration study.   INTERPRETING PHYSICIAN:   Drew Herman A. Epimenio Foot, MD, PhD, [facility] Certified in Neurology, Clinical Neurophysiology, Sleep Medicine, Pain Medicine and Neuroimaging  [facility] [address] [place], [place] [address] [phone]

## 2020-04-08 NOTE — Telephone Encounter (Signed)
Timothy Love - This patient wanted [name] to know he will be starting the "kesimppa". It should arrive today so he wanted to discuss the process. Please give him a call about starting. Thank you

## 2020-04-14 ENCOUNTER — Other Ambulatory Visit: Payer: Self-pay | Admitting: Neurology

## 2020-06-14 ENCOUNTER — Telehealth: Payer: Self-pay | Admitting: Neurology

## 2020-06-14 DIAGNOSIS — G35 Multiple sclerosis: Secondary | ICD-10-CM

## 2020-06-14 MED ORDER — KESIMPTA 20 MG/0.4ML ~~LOC~~ SOAJ: 0.4000 mL | SUBCUTANEOUS | 3 refills | Status: DC

## 2020-06-14 NOTE — Telephone Encounter (Signed)
[facility] back. Spoke with [place], she transferred me to pharmacist, Angie. Provided VO for Kesimpta prescription and ICD 10: G35.

## 2020-06-14 NOTE — Telephone Encounter (Signed)
[facility] [name]) called, need prescription for KESIMPTA 20 MG/0.4ML SOAJ and ICD-10 code. Would like a call from the nurse.  Contact info:  [phone]  Option 2

## 2020-06-15 ENCOUNTER — Telehealth: Payer: Self-pay | Admitting: Neurology

## 2020-06-15 NOTE — Telephone Encounter (Signed)
Reviewed pt chart. He has been on Kesimpta since around 04/08/20. Tried/failed: [name], [name], Ocrevus.

## 2020-06-15 NOTE — Telephone Encounter (Signed)
[facility] Timothy Love) called, need PA for Ofatumumab (KESIMPTA) 20 MG/0.4ML SOAJ. Can contact insurance for a verbal PA, contact [phone].

## 2020-06-15 NOTE — Telephone Encounter (Signed)
PA form completed, signed by [name] and faxed back to [facility] with the requested medical records (fax: [phone]). Confirmation received. Decision pending. Notification of outcome will be faxed to [phone].

## 2020-06-15 NOTE — Telephone Encounter (Signed)
The patient has coverage through [employer] now. I called him for the ID number. He just got the card one day ago. I requested he send [place] a copy through mychart.  KG#[id number]  I called [employer] at the number below. They were unable to take the PA ver the phone and will fax a form for completion.

## 2020-06-16 NOTE — Telephone Encounter (Signed)
Received fax from [employer] that PA approved 06/16/20-06/16/21. Must be filled via Pension scheme manager.

## 2020-07-12 ENCOUNTER — Other Ambulatory Visit: Payer: Self-pay | Admitting: Neurology

## 2020-08-11 ENCOUNTER — Other Ambulatory Visit: Payer: Self-pay | Admitting: *Deleted

## 2020-08-11 ENCOUNTER — Encounter: Payer: Self-pay | Admitting: Neurology

## 2020-08-11 ENCOUNTER — Ambulatory Visit (INDEPENDENT_AMBULATORY_CARE_PROVIDER_SITE_OTHER): Payer: PRIVATE HEALTH INSURANCE | Admitting: Neurology

## 2020-08-11 VITALS — BP 166/88 | HR 65 | Ht 72.0 in | Wt 238.5 lb

## 2020-08-11 DIAGNOSIS — G4719 Other hypersomnia: Secondary | ICD-10-CM

## 2020-08-11 DIAGNOSIS — R269 Unspecified abnormalities of gait and mobility: Secondary | ICD-10-CM

## 2020-08-11 DIAGNOSIS — G4733 Obstructive sleep apnea (adult) (pediatric): Secondary | ICD-10-CM

## 2020-08-11 DIAGNOSIS — G8311 Monoplegia of lower limb affecting right dominant side: Secondary | ICD-10-CM | POA: Diagnosis not present

## 2020-08-11 DIAGNOSIS — Z79899 Other long term (current) drug therapy: Secondary | ICD-10-CM

## 2020-08-11 DIAGNOSIS — G35 Multiple sclerosis: Secondary | ICD-10-CM | POA: Diagnosis not present

## 2020-08-11 DIAGNOSIS — R5383 Other fatigue: Secondary | ICD-10-CM

## 2020-08-11 DIAGNOSIS — R0683 Snoring: Secondary | ICD-10-CM

## 2020-08-11 MED ORDER — BACLOFEN 10 MG PO TABS
ORAL_TABLET | ORAL | 4 refills | Status: DC
Start: 2020-08-11 — End: 2021-04-28

## 2020-08-11 NOTE — Progress Notes (Signed)
[facility]  PATIENT: Timothy Love DOB: 09-05-1960  REFERRING DOCTOR OR PCP: Levert Feinstein MD, PhD SOURCE: Patient, notes from Dr. Terrace Arabia, imaging and lab reports, MRI images personally reviewed.  _________________________________   HISTORICAL  CHIEF COMPLAINT:  Chief Complaint  Patient presents with  . Follow-up    RM 12, alone. Last seen 02/23/2020. On Kesimpta for MS. Ambulates w/ cane. More balance issues. Has fallen. Most recent fall yesterday afternoon. No injuries. About 25 falls in the last month.     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   No significant skin reactions.  No infections.    He denies any exacerbations but gait is mildly worse.  He has a poor gait due to right leg weakness and spasticity.    He uses a cane.  He has also noted more difficulty with fine motor and gross motor use of the right hand.   He has right leg spasticity. He wakes up with spasms a lot.    He takes baclofen 10 mg po tid.    He has some dysesthesias, helped by gabapentin.  He switched to Lyrica early 2020 and feel it is no better.   He is sleepy durig the day.   He snores loudly at night and his wife sleeps in a different room.  She does not note OSA signs.    He has gained weight the last few years (since starting Lyrica).  He is eating more as he is less active at work and around the house.    He has had some depression.  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  Due to reduced mobility, he was already limited to not use ladders or climbing more than a few feet.  He is right-hand dominant and the right hand has reduced fine motor control and reduced ability to do rapid alternating movements.  There is reduced strength in the right arm and leg.  He gets spasms when he sits for long time of the right leg.  Additionally, he fatigues easily.Marland Kitchen  He had Covid-21 December 2018.    He felt very tired a few days but had no severe issues.      MS History He was diagnosed with MS in 2013.  In April 2013 he noticed numbness that started in the feet and ascended to the waist line.   He also had pain in the thighs at the onset. Marland Kitchen  He also had mild weakness in the legs.  He had difficulty with his gait and also had urinary and bowel incontinence 20 2013.  He saw orthopedics and had MRI of the thoracic spine showing T2 lesions.  MRI of the brain in 2013 showed multiple T2/flair hyperintense foci including 1 in the right parieto-occipital lobe that had enhancement.  Initially, he was placed on  the drip.  He was on [name] from January 2014 through 02/20/2013 and then switched to [facility] November 2014.  He continued on [name] until 02/20/2017 when he was switched to Ocrevus.   At the time, his right leg was doing worse though there was no definite exacerbation.  He has tolerated Ocrevus but is wondering about different therapeutic options.  Specifically, he would like to discuss Kesimpta.   Imaging review I personally reviewed the MRI of the brain from 10/01/2015.  It shows multiple T2/FLAIR hyperintense foci scattered through the periventricular, juxtacortical and deep white matter.  None of the foci appear to be acute.  MRI of the cervical spine 10/08/2015 showed foci to the right at C3, centrally at C4, anterolaterally to the left at C4-C5 and to the right at C5-C6.  MRI of the thoracic spine 10/08/2015 showed a focus to the right at T1-T2 a larger focus to the  left at T7-T8 and a central focus at T9.  Additionally, there are endplate chronic fractures of T7 and T10.  Laboratory review: CSF November 2013 showed more than 5 oligoclonal bands.  Visual evoked potential showed prolonged P100 latencies on the left consistent with history of left optic neuritis.  Sleep study 04/07/2020 obstructive sleep apnea with an AHI of 57.9   REVIEW OF SYSTEMS: Constitutional: No fevers, chills, sweats, or change in appetite.    He has fatigue and sleepiness.   Eyes: No visual changes, double vision, eye pain Ear, nose and throat: No hearing loss, ear pain, nasal congestion, sore throat Cardiovascular: No chest pain, palpitations Respiratory: No shortness of breath at rest or with exertion.   No wheezes.  He snores. GastrointestinaI: No nausea, vomiting, diarrhea, abdominal pain, fecal incontinence Genitourinary: No dysuria, urinary retention or frequency.  No nocturia. Musculoskeletal: No neck pain, back pain.  Pain in the right leg from spasticity Integumentary: No rash, pruritus, skin lesions Neurological: as above Psychiatric: No depression at this time.  No anxiety Endocrine: No palpitations, diaphoresis, change in appetite, change in weigh or increased thirst Hematologic/Lymphatic: No anemia, purpura, petechiae. Allergic/Immunologic: No itchy/runny eyes, nasal congestion, recent allergic reactions, rashes  ALLERGIES: Allergies  Allergen Reactions  . Penicillins Other (See Comments)    Child-doesn't know    HOME MEDICATIONS:  Current Outpatient Medications:  .  Cholecalciferol (VITAMIN D PO), Take 2,000 Units by mouth daily. , Disp: , Rfl:  .  clonazePAM (KLONOPIN) 0.5 MG tablet, Take 1 tablet (0.5 mg total) by mouth at bedtime as needed for anxiety., Disp: 30 tablet, Rfl: 5 .  DULoxetine (CYMBALTA) 60 MG capsule, Take 1 capsule by mouth once daily, Disp: 90 capsule, Rfl: 1 .  FLUoxetine (PROZAC) 40 MG capsule, 40 mg., Disp: , Rfl:  .   gabapentin (NEURONTIN) 600 MG tablet, Take 1 tablet (600 mg total) by mouth 3 (three) times daily., Disp: 360 tablet, Rfl: 3 .  modafinil (PROVIGIL) 200 MG tablet, Take 1 tablet (200 mg total) by mouth daily., Disp: 90 tablet, Rfl: 3 .  Ofatumumab (KESIMPTA) 20 MG/0.4ML SOAJ, Inject 0.4 mLs into the skin every 30 (thirty) days., Disp: 1.2 mL, Rfl: 3 .  pravastatin (PRAVACHOL) 10 MG tablet, Take 10 mg by mouth daily., Disp: , Rfl:  .  tolterodine (DETROL) 2 MG tablet, Take 1 tablet (2 mg total) by mouth 2 (two) times daily., Disp: 180 tablet, Rfl: 3 .  baclofen (LIORESAL) 10 MG tablet, Take one po qAm, one po qPM and two po qHS, Disp: 360 each, Rfl: 4  PAST MEDICAL HISTORY: Past Medical History:  Diagnosis Date  . Anxiety   . Depression   . Kidney stone   . MS (multiple sclerosis) (HCC)   . Unspecified cause of encephalitis, myelitis, and encephalomyelitis     PAST SURGICAL HISTORY: No past surgical history on file.  FAMILY HISTORY: Family History  Problem Relation Age of Onset  . High Cholesterol Mother   . Heart Problems Mother   . Heart attack Father     SOCIAL HISTORY:  Social History   Socioeconomic History  . Marital status: Married    Spouse name: cynthia  . Number of children: 2  . Years of education: college  . Highest education level: Not on file  Occupational History    Employer: [employer]  Tobacco Use  . Smoking status: Never Smoker  . Smokeless tobacco: Never Used  Substance and Sexual Activity  . Alcohol use: No    Alcohol/week: 0.0 standard drinks  . Drug use: No  . Sexual activity: Not on file  Other Topics Concern  . Not on file  Social History Narrative  . Not on file   Social Determinants of Health   Financial Resource Strain: Not on file  Food Insecurity: Not on file  Transportation Needs: Not on file  Physical Activity: Not on file  Stress: Not on file  Social Connections: Not on file  Intimate Partner Violence: Not on  file     PHYSICAL EXAM  Vitals:   08/11/20 1038  BP: (!) 166/88  Pulse: 65  Weight: 238 lb 8 oz (108.2 kg)  Height: 6' (1.829 m)    Body mass index is 32.35 kg/m.   General: The patient is well-developed and well-nourished and in no acute distress.  Pharynx is Mallampati 2  HEENT:  Head is [place]/AT.  Sclera are anicteric.  Funduscopic exam shows normal optic discs and retinal vessels.  Neck: No carotid bruits are noted.  The neck is nontender.  Cardiovascular: The heart has a regular rate and rhythm with a normal S1 and S2. There were no murmurs, gallops or rubs.    Skin: Extremities are without rash or  edema.  Musculoskeletal:  Back is nontender  Neurologic Exam  Mental status: The patient is alert and oriented x 3 at the time of the examination. The patient has apparent normal recent and remote memory, with an apparently normal attention span and concentration ability.   Speech is normal.  Cranial nerves: Extraocular movements are full. Pupils are equal, round, and reactive to light and accomodation.  Visual fields are full.  Facial symmetry is present. There is good facial sensation to soft touch bilaterally.Facial strength is normal.  Trapezius and sternocleidomastoid strength is normal. No dysarthria is noted.  The tongue is midline, and the patient has symmetric elevation of the soft palate. No obvious hearing deficits are noted.  Motor:  Muscle bulk is normal.   Tone is increased on the right, leg more than arm.. Strength is 4+to 5/5 in the right arm and hand.  He has reduced rapid altering movements in the hand.  Strength is 4 to 4+/5 in the right leg  Sensory: He has reduced vibration sensation in the right leg.  Normal touch sensation.  Coordination: He has reduced finger-nose-finger in the right arm.  He cannot do heel-to-shin with the right leg.  Gait and station: Station is fairly normal though he needs to use his hands to stand up from the chair.   He  has a  spastic gait with a reduced stride.  There is a right foot drop.  He is unable to do a tandem walk.  Romberg is negative.   Reflexes: Deep tendon reflexes are asymmetrically increased in the legs, with crossed abductor responses at the right knee.  Reflexes were more symmetric in the arm.   Plantar responses are flexor.    DIAGNOSTIC DATA (LABS, IMAGING, TESTING) - I reviewed patient records, labs, notes, testing and imaging myself where available.  Lab Results  Component Value Date   WBC 5.7 03/17/2019   HGB 14.3 03/17/2019   HCT 44.1 03/17/2019   MCV 89 03/17/2019   PLT 292 03/17/2019      Component Value Date/Time   NA 140 03/17/2019 1407   K 4.3 03/17/2019 1407   CL 105 03/17/2019 1407   CO2 19 (L) 03/17/2019 1407   GLUCOSE 165 (H) 03/17/2019 1407   BUN 22 03/17/2019 1407   CREATININE 0.87 03/17/2019 1407   CALCIUM 9.5 03/17/2019 1407   PROT 6.3 03/17/2019 1407   ALBUMIN 4.3 03/17/2019 1407   AST 20 03/17/2019 1407   ALT 23 03/17/2019 1407   ALKPHOS 123 (H) 03/17/2019 1407   BILITOT <0.2 03/17/2019 1407   GFRNONAA 95 03/17/2019 1407   GFRAA 110 03/17/2019 1407   No results found for: CHOL, HDL, LDLCALC, LDLDIRECT, TRIG, CHOLHDL No results found for: RFXJ8I Lab Results  Component Value Date   VITAMINB12 201 (L) 02/01/2017   Lab Results  Component Value Date   TSH 0.950 11/28/2017       ASSESSMENT AND PLAN  MS (multiple sclerosis) (HCC) - Plan: MR BRAIN WO CONTRAST, MR CERVICAL SPINE WO CONTRAST  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   Continue Kesimpta.   Check labs.   Check MRI brain and cervical spine 2.    Because of the increased spasticity in the leg, I will increase his nighttime baclofen from 10 mg to 20 mg (will take 01-11-19 mg over the day).  He also has  dysesthesias.  Continue gabapentin for dysesthesias 3.    APAP 5-20 for severe OSA 4.    RTC 6 months      Levi Klaiber A. Epimenio Foot, MD, [facility] 08/11/2020, 11:35 AM Certified in Neurology, Clinical Neurophysiology, Sleep Medicine and Neuroimaging  [facility] [address] [place], [place] [address] [phone]

## 2020-08-12 LAB — IGG, IGA, IGM
IgA/Immunoglobulin A, Serum: 107 mg/dL (ref 90–386)
IgG (Immunoglobin G), Serum: 604 mg/dL (ref 603–1613)
IgM (Immunoglobulin M), Srm: 44 mg/dL (ref 20–172)

## 2020-08-12 LAB — CBC WITH DIFFERENTIAL/PLATELET
Basophils Absolute: 0.1 10*3/uL (ref 0.0–0.2)
Basos: 2 %
EOS (ABSOLUTE): 0.3 10*3/uL (ref 0.0–0.4)
Eos: 5 %
Hematocrit: 45.5 % (ref 37.5–51.0)
Hemoglobin: 14.8 g/dL (ref 13.0–17.7)
Immature Grans (Abs): 0 10*3/uL (ref 0.0–0.1)
Immature Granulocytes: 0 %
Lymphocytes Absolute: 1.1 10*3/uL (ref 0.7–3.1)
Lymphs: 17 %
MCH: 29.2 pg (ref 26.6–33.0)
MCHC: 32.5 g/dL (ref 31.5–35.7)
MCV: 90 fL (ref 79–97)
Monocytes Absolute: 0.7 10*3/uL (ref 0.1–0.9)
Monocytes: 12 %
Neutrophils Absolute: 4 10*3/uL (ref 1.4–7.0)
Neutrophils: 64 %
Platelets: 281 10*3/uL (ref 150–450)
RBC: 5.07 x10E6/uL (ref 4.14–5.80)
RDW: 12.8 % (ref 11.6–15.4)
WBC: 6.2 10*3/uL (ref 3.4–10.8)

## 2020-08-12 LAB — HEPATIC FUNCTION PANEL
ALT: 23 IU/L (ref 0–44)
AST: 22 IU/L (ref 0–40)
Albumin: 4.5 g/dL (ref 3.8–4.9)
Alkaline Phosphatase: 120 IU/L (ref 44–121)
Bilirubin Total: 0.2 mg/dL (ref 0.0–1.2)
Bilirubin, Direct: 0.1 mg/dL (ref 0.00–0.40)
Total Protein: 6.5 g/dL (ref 6.0–8.5)

## 2020-08-17 ENCOUNTER — Telehealth: Payer: Self-pay

## 2020-08-17 NOTE — Telephone Encounter (Signed)
I have submitted a PA request for MRI Brain and Cervical spine w/o contrast to [employer], confirmation # [id number]. Awaiting determination.

## 2020-08-23 NOTE — Telephone Encounter (Signed)
Timothy Love  at [facility]  has called to give Auth#[id number] Date range May 23rd-June 22nd.

## 2020-08-23 NOTE — Telephone Encounter (Signed)
I received an email from [place] at [facility] stating that she had been notified by [facility] that I had started a PA request, however, [employer] only forwards them the contact name and email address of the person that started the request.  I completed the PA over the phone with Lurena Joiner and she will contact us back with a determination. Her callback number is [phone]. [phone].

## 2020-08-24 NOTE — Telephone Encounter (Signed)
Noted, thank you

## 2020-08-25 NOTE — Telephone Encounter (Signed)
Patient returned my call and states he would like to go to [facility]. I called Lurena Joiner, who help US obtain auth for MRI, at [phone] to switch site location to [facility]. She was able to change it and advises that the auth number will stay the same.   I called [facility] @ [phone] and spoke with Tobi Bastos. She advised me to fax the order, demographics, and authorization to [phone].   Faxed all items, then called patient back to let him know. He was appreciative.

## 2020-08-25 NOTE — Telephone Encounter (Signed)
Patient called and LVM to ask that his MRI be done in [place]. I called patient back but was routed to voicemail. I requested that he call me back to let me know the exact location. Once I know I will call insurance and switch the site location.

## 2020-08-26 NOTE — Telephone Encounter (Signed)
[facility] Victorino Dike) called, order faxed was not signed . Can you refax the order with Dr. Bonnita Hollow signature?

## 2020-08-31 NOTE — Telephone Encounter (Signed)
Faxed signed orders.

## 2020-08-31 NOTE — Telephone Encounter (Signed)
Order given to MD to sign.

## 2020-09-08 ENCOUNTER — Telehealth: Payer: Self-pay | Admitting: Neurology

## 2020-09-08 ENCOUNTER — Telehealth: Payer: Self-pay | Admitting: *Deleted

## 2020-09-08 NOTE — Telephone Encounter (Signed)
Sent high priority community message to [facility] letting them know pt has not heard from anyone after orders were placed 08/11/20. I did sent them community message then as well. Asked them to follow up with pt. I called pt to let him know update. He verbalized understanding. He will let me know if he does not hear from them.

## 2020-09-08 NOTE — Telephone Encounter (Signed)
Pt states on his last f/u with Dr Epimenio Foot he was asked if anyone has contacted him about a CPAP when pt states he told Dr Epimenio Foot no, he was told he should have been contacted by someone by now.  Please call pt.

## 2020-09-08 NOTE — Telephone Encounter (Signed)
Request made [facility] [phone] mri cervical mri brain.

## 2020-09-08 NOTE — Telephone Encounter (Signed)
Received the following message back from [place] w/ [facility]: "This pt lives in [place], so this order was to be processed by the [place] team. I sent it to them on 05/12, and it looks like they have yet to process it. I have sent a message to the regional manager over this area to advise. Someone will reach out to the pt soon. "

## 2020-09-14 ENCOUNTER — Telehealth: Payer: Self-pay | Admitting: *Deleted

## 2020-09-14 NOTE — Telephone Encounter (Signed)
R/c cd and report from [email or web address] on emma desk

## 2020-09-21 ENCOUNTER — Encounter: Payer: Self-pay | Admitting: Neurology

## 2020-09-21 NOTE — Telephone Encounter (Signed)
error 

## 2020-09-21 NOTE — Telephone Encounter (Signed)
Sent high priority message to [facility] letting them know pt still has not heard from anyone. Asked they follow up with pt today to provide an update

## 2020-09-21 NOTE — Telephone Encounter (Signed)
Pt called, update on status of getting CPAP machine. Would like a call from the nurse.

## 2020-09-22 NOTE — Telephone Encounter (Signed)
Received message from Clarendon Brown/[facility]: "Oh my goodness, I am SO sorry. I was told by management that this would be taken care of on 06/09. This will be handled ASAP and I apologize again for the delay. Also, just a heads up, my last day is Friday. Please send any future messagesto Dimas Millin and Jari Favre. Thank you!"

## 2020-09-22 NOTE — Telephone Encounter (Signed)
Called pt back and provided update. He will call me back if he does not hear from them this week.

## 2020-09-23 NOTE — Telephone Encounter (Signed)
Faxed signed order for cpap to [facility] [place] [address]  [phone], Fax: [phone]. Received fax confirmation.

## 2020-10-03 ENCOUNTER — Telehealth: Payer: Self-pay | Admitting: Neurology

## 2020-10-03 NOTE — Telephone Encounter (Signed)
I reviewed the MRI of the brain and cervical spine from 09/07/2020 and compared it with previous MRIs (MRI of the brain 03/08/2018 and MRI of the cervical spine 03/21/2019)  MRI of the brain 09/07/2020 showed some scattered T2/FLAIR hyperintense foci in the hemispheres and 1 subtle focus in the left medulla.  The overall plaque burden is low.  None of the foci appear to be acute.  Compared to the MRI from 2019, there were no definite new lesions.  MRI of the cervical spine 09/07/2020 showed T2 hyperintense foci to the right at C2-C3, to the right at C3-C4, centrally at C4-C5, to the left at C5 and to the right at C6.  This appears unchanged compared to the previous MRI from 03/21/2019.

## 2020-10-05 ENCOUNTER — Other Ambulatory Visit: Payer: Self-pay | Admitting: Neurology

## 2020-10-05 MED ORDER — GABAPENTIN 600 MG PO TABS: 600.0000 mg | ORAL_TABLET | Freq: Three times a day (TID) | ORAL | 3 refills | Status: DC

## 2020-10-05 MED ORDER — TOLTERODINE TARTRATE 2 MG PO TABS: 2.0000 mg | ORAL_TABLET | Freq: Two times a day (BID) | ORAL | 3 refills | Status: DC

## 2020-10-05 NOTE — Telephone Encounter (Signed)
E-scribed tolterodine. Sent request for MD to e-scribe gabapentin since pt in [place].

## 2020-10-05 NOTE — Telephone Encounter (Signed)
Pt is requesting refill for tolterodine (DETROL) 2 MG tablet and gabapentin (NEURONTIN) 600 MG tablet. Pharmacy [facility] [phone].

## 2021-01-11 ENCOUNTER — Other Ambulatory Visit: Payer: Self-pay | Admitting: Neurology

## 2021-02-16 ENCOUNTER — Telehealth: Payer: Self-pay | Admitting: *Deleted

## 2021-02-16 NOTE — Telephone Encounter (Signed)
Received fax from [employer] that all specialty PA's require [place] to contact [employer] at [phone]. I called but received automated message that they are temporarily closed d/t staff training. Gives no indication when they will be back open. I tried initiating new key on CMM through [employer] but was advised to call [phone]. I called and received same message. I will try calling again later.

## 2021-02-16 NOTE — Telephone Encounter (Signed)
Submitted PA Kesimpta on CMM. [id number]. Waiting on determination from [employer].

## 2021-02-16 NOTE — Telephone Encounter (Signed)
Called [employer] back and spoke with [place] She placed me on hold to see which department I needed to go to in order to complete PA. Spoke w/ her Production designer, theatre/television/film. Took MD name/fax# [phone]. She will send request to correct department to send [place] PA. Ref# for call [id number].

## 2021-02-17 NOTE — Telephone Encounter (Signed)
Faxed completed/signed PA form w/ office notes attached to [employer] at [phone]. Received fax confirmation, waiting on determination.

## 2021-02-21 NOTE — Telephone Encounter (Signed)
Called [employer], spoke with [place]. PA approved. Auth # [id number]. Approval faxed to [employer] for approval. They will f/u with our office to provide further approval info after [facility] approves. Nothing further needed at this time.

## 2021-02-22 ENCOUNTER — Ambulatory Visit: Payer: PRIVATE HEALTH INSURANCE | Admitting: Neurology

## 2021-02-22 NOTE — Telephone Encounter (Signed)
Received fax from [facility] that PA approved until 02/17/22.

## 2021-04-10 ENCOUNTER — Other Ambulatory Visit: Payer: Self-pay | Admitting: Neurology

## 2021-04-15 ENCOUNTER — Other Ambulatory Visit: Payer: Self-pay | Admitting: Neurology

## 2021-04-28 ENCOUNTER — Encounter: Payer: Self-pay | Admitting: Neurology

## 2021-04-28 ENCOUNTER — Ambulatory Visit (INDEPENDENT_AMBULATORY_CARE_PROVIDER_SITE_OTHER): Payer: PRIVATE HEALTH INSURANCE | Admitting: Neurology

## 2021-04-28 VITALS — BP 166/91 | HR 81 | Ht 72.0 in | Wt 225.0 lb

## 2021-04-28 DIAGNOSIS — G35 Multiple sclerosis: Secondary | ICD-10-CM

## 2021-04-28 MED ORDER — KESIMPTA 20 MG/0.4ML ~~LOC~~ SOAJ: 0.4000 mL | SUBCUTANEOUS | 3 refills | Status: DC

## 2021-04-28 MED ORDER — BACLOFEN 10 MG PO TABS: ORAL_TABLET | ORAL | 4 refills | Status: DC

## 2021-04-28 MED ORDER — DULOXETINE HCL 60 MG PO CPEP: 60.0000 mg | ORAL_CAPSULE | Freq: Every day | ORAL | 1 refills | Status: DC

## 2021-04-28 NOTE — Progress Notes (Addendum)
PATIENT: Timothy Love DOB: 12/18/1960  REASON FOR VISIT: Follow up for MS HISTORY FROM: Patient PRIMARY NEUROLOGIST: [name]   HISTORY OF PRESENT ILLNESS: Today 04/28/21 Timothy Love is here today for follow-up with history of MS, is on Kesimpta, tolerates well, was previously on Ocrevus.    Had MRI of the brain and cervical spine in June 2022.   MRI of the brain 09/07/2020 showed some scattered T2/FLAIR hyperintense foci in the hemispheres and 1 subtle focus in the left medulla.  The overall plaque burden is low.  None of the foci appear to be acute.  Compared to the MRI from 2019, there were no definite new lesions.   MRI of the cervical spine 09/07/2020 showed T2 hyperintense foci to the right at C2-C3, to the right at C3-C4, centrally at C4-C5, to the left at C5 and to the right at C6.  This appears unchanged compared to the previous MRI from 03/21/2019.  Seeing someone in [employer] who is a Land, doing some tens units work to the feet, left feels better, color is better.   Timothy Love will take higher dose baclofen and gabapentin at night if needed, but so far is doing well. Gets B 12 shot once a month, on Vitamin D supplement. Some fall, right foot gets hung.  Timothy Love picked up CPAP machine. Didn't understand how to use it. At the time having terrible spasms, couldn't get comfortable for CPAP. Tried for 2 weeks, couldn't sleep, because couldn't turn over because it was making noises. 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 Doesn't want to try CPAP again right now.   HISTORY Update 08/11/2020 [name]: Timothy Love is on Kesimpta and tolerates it well.   No significant skin reactions.  No infections.    Timothy Love denies any exacerbations but gait is mildly worse.   Timothy Love has a poor gait due to right leg weakness and spasticity.    Timothy Love uses a cane.  Timothy Love has also noted more difficulty with fine motor and gross motor use of the right hand.   Timothy Love has right leg spasticity. Timothy Love wakes up with spasms a lot.    Timothy Love takes baclofen 10 mg po  tid.    Timothy Love has some dysesthesias, helped by gabapentin.  Timothy Love switched to Lyrica early 2020 and feel it is no better.    Timothy Love is sleepy durig the day.   Timothy Love snores loudly at night and his wife sleeps in a different room.  She does not note OSA signs.    Timothy Love has gained weight the last few years (since starting Lyrica).  Timothy Love is eating more as Timothy Love is less active at work and around the house.    Timothy Love has had some depression.   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  Due to reduced mobility, Timothy Love was already limited to not use ladders or climbing more than a few feet.  Timothy Love is right-hand dominant and the right hand has reduced fine motor control and reduced ability to do rapid alternating movements.  There is reduced strength in the right arm and leg.  Timothy Love gets spasms when Timothy Love sits for long time of the right leg.  Additionally, Timothy Love fatigues easily.Marland Kitchen   Timothy Love had Covid-21 December 2018.    Timothy Love felt very tired a few days but had no severe issues.        MS History Timothy Love was diagnosed with MS in 2013.  In April 2013 Timothy Love noticed numbness that started in the feet and ascended to the waist line.   Timothy Love also had pain in the thighs at the onset. Marland Kitchen  Timothy Love also had mild weakness in the legs.  Timothy Love had difficulty with his gait and also had urinary and bowel incontinence 20 2013.  Timothy Love saw orthopedics and had MRI of the thoracic spine showing T2 lesions.  MRI of the brain in 2013  showed multiple T2/flair hyperintense foci including 1 in the right parieto-occipital lobe that had enhancement.  Initially, Timothy Love was placed on the drip.  Timothy Love was on [name] from January 2014 through 02/20/2013 and then switched to [facility] November 2014.  Timothy Love continued on [name] until 02/20/2017 when Timothy Love was switched to [place].   At the time, his right leg was doing worse though there was no definite exacerbation.  Timothy Love has tolerated Ocrevus but is wondering about different therapeutic options.  Specifically, Timothy Love would like to discuss Kesimpta.    REVIEW OF SYSTEMS: Out of a complete 14 system review of symptoms, the patient complains only of the following symptoms, and all other reviewed systems are negative.   See HPI  ALLERGIES: Allergies  Allergen Reactions   Penicillins Other (See Comments)    Child-doesn't know    HOME MEDICATIONS: Outpatient Medications Prior to Visit  Medication Sig Dispense Refill   baclofen (LIORESAL) 10 MG tablet Take one po qAm, one po qPM and two po qHS 360 each 4   Cholecalciferol (VITAMIN D PO) Take 5,000 Units by mouth daily.     clonazePAM (KLONOPIN) 0.5 MG tablet Take 1 tablet (0.5 mg total) by mouth at bedtime as needed for anxiety. 30 tablet 5   cyanocobalamin (,VITAMIN B-12,) 1000 MCG/ML injection Inject 1,000 mcg into the muscle every 30 (thirty) days.     DULoxetine (CYMBALTA) 60 MG capsule Take 1 capsule (60 mg total) by mouth daily. 90 capsule 0   FLUoxetine (PROZAC) 40 MG capsule 40 mg.     gabapentin (NEURONTIN) 600 MG tablet TAKE 1 TABLET BY MOUTH THREE TIMES DAILY 270 tablet 0   modafinil (PROVIGIL) 200 MG tablet Take 1 tablet (200 mg total) by mouth daily. 90 tablet 3   Ofatumumab (KESIMPTA) 20 MG/0.4ML  SOAJ Inject 0.4 mLs into the skin every 30 (thirty) days. 1.2 mL 3   pravastatin (PRAVACHOL) 10 MG tablet Take 10 mg by mouth daily.     tolterodine (DETROL) 2 MG tablet Take 1 tablet (2 mg total) by mouth 2 (two) times daily. 180 tablet 3   No  facility-administered medications prior to visit.    PAST MEDICAL HISTORY: Past Medical History:  Diagnosis Date   Anxiety    Depression    Kidney stone    MS (multiple sclerosis) ([place])    Unspecified cause of encephalitis, myelitis, and encephalomyelitis     PAST SURGICAL HISTORY: No past surgical history on file.  FAMILY HISTORY: Family History  Problem Relation Age of Onset   High Cholesterol Mother    Heart Problems Mother    Heart attack Father     SOCIAL HISTORY: Social History   Socioeconomic History   Marital status: Married    Spouse name: cynthia   Number of children: 2   Years of education: college   Highest education level: Not on file  Occupational History    Employer: [employer]  Tobacco Use   Smoking status: Never   Smokeless tobacco: Never  Substance and Sexual Activity   Alcohol use: No    Alcohol/week: 0.0 standard drinks   Drug use: No   Sexual activity: Not on file  Other Topics Concern   Not on file  Social History Narrative   Not on file   Social Determinants of Health   Financial Resource Strain: Not on file  Food Insecurity: Not on file  Transportation Needs: Not on file  Physical Activity: Not on file  Stress: Not on file  Social Connections: Not on file  Intimate Partner Violence: Not on file      PHYSICAL EXAM  Vitals:   04/28/21 1351  BP: (!) 166/91  Pulse: (!) 128  Weight: 225 lb (102.1 kg)  Height: 6' (1.829 m)   Body mass index is 30.52 kg/m.  Generalized: Well developed, in no acute distress   Neurological examination  Mentation: Alert oriented to time, place, history taking. Follows all commands speech and language fluent Cranial nerve II-XII: Pupils were equal round reactive to light. Extraocular movements were full, visual field were full on confrontational test. Facial sensation and strength were normal. Head turning and shoulder shrug  were normal and symmetric. Motor: Right upper  extremity strength 4/5, right leg 4/5, right foot drop Sensory: Sensory testing is intact to soft touch on all 4 extremities. No evidence of extinction is noted.  Coordination: Finger-nose-finger bilaterally is okay, can't do the right leg well  Gait and station: Has to push off from seated position to stand, spastic type gait, right foot drop, uses a single-point cane Reflexes: Deep tendon reflexes are symmetric but increased   DIAGNOSTIC DATA (LABS, IMAGING, TESTING) - I reviewed patient records, labs, notes, testing and imaging myself where available.  Lab Results  Component Value Date   WBC 6.2 08/11/2020   HGB 14.8 08/11/2020   HCT 45.5 08/11/2020   MCV 90 08/11/2020   PLT 281 08/11/2020      Component Value Date/Time   NA 140 03/17/2019 1407   K 4.3 03/17/2019 1407   CL 105 03/17/2019 1407   CO2 19 (L) 03/17/2019 1407   GLUCOSE 165 (H) 03/17/2019 1407   BUN 22 03/17/2019 1407   CREATININE 0.87 03/17/2019 1407   CALCIUM 9.5 03/17/2019 1407   PROT 6.5 08/11/2020  1142   ALBUMIN 4.5 08/11/2020 1142   AST 22 08/11/2020 1142   ALT 23 08/11/2020 1142   ALKPHOS 120 08/11/2020 1142   BILITOT 0.2 08/11/2020 1142   GFRNONAA 95 03/17/2019 1407   GFRAA 110 03/17/2019 1407   No results found for: CHOL, HDL, LDLCALC, LDLDIRECT, TRIG, CHOLHDL No results found for: HGBA1C Lab Results  Component Value Date   VITAMINB12 201 (L) 02/01/2017   Lab Results  Component Value Date   TSH 0.950 11/28/2017      ASSESSMENT AND PLAN 61 y.o. year old male  has a past medical history of Anxiety, Depression, Kidney stone, MS (multiple sclerosis) ([place]), and Unspecified cause of encephalitis, myelitis, and encephalomyelitis. here with:  1.  Multiple sclerosis 2.  Gait abnormality 3.  OSA  -Timothy Love is doing well today, very pleasant -Didn't enjoy CPAP, right now doesn't want to retry, we talked about risk, sleep study show severe OSA, may reconsider in future -Continue Kesimpta, check labs  today -MRI of the brain and cervical spine imaging was done in June 2022, was overall stable -Continue baclofen 01-11-19 mg daily  -Continue gabapentin 600 mg 3 times daily -Continue Detrol 2 mg twice daily -Follow-up in 6 months or sooner if needed  Butler Denmark, AGNP-C, DNP 04/28/2021, 1:54 PM [facility] [address] [place], [place] [address] [phone]   I have read the note, and I agree with the clinical assessment and plan.  Richard A. Felecia Shelling, MD, PhD, [facility] Certified in Neurology, Clinical Neurophysiology, Sleep Medicine, Pain Medicine and Neuroimaging  [facility] [address], [place],  [address] [phone]

## 2021-04-28 NOTE — Patient Instructions (Signed)
Continue current medications Let me know about CPAP Check labs  See you back in 6 months

## 2021-04-29 LAB — IGG, IGA, IGM
IgA/Immunoglobulin A, Serum: 95 mg/dL (ref 90–386)
IgG (Immunoglobin G), Serum: 615 mg/dL (ref 603–1613)
IgM (Immunoglobulin M), Srm: 45 mg/dL (ref 20–172)

## 2021-04-29 LAB — CBC WITH DIFFERENTIAL/PLATELET
Basophils Absolute: 0.1 10*3/uL (ref 0.0–0.2)
Basos: 1 %
EOS (ABSOLUTE): 0.2 10*3/uL (ref 0.0–0.4)
Eos: 4 %
Hematocrit: 42.9 % (ref 37.5–51.0)
Hemoglobin: 14.5 g/dL (ref 13.0–17.7)
Immature Grans (Abs): 0 10*3/uL (ref 0.0–0.1)
Immature Granulocytes: 0 %
Lymphocytes Absolute: 1.1 10*3/uL (ref 0.7–3.1)
Lymphs: 17 %
MCH: 30.4 pg (ref 26.6–33.0)
MCHC: 33.8 g/dL (ref 31.5–35.7)
MCV: 90 fL (ref 79–97)
Monocytes Absolute: 0.6 10*3/uL (ref 0.1–0.9)
Monocytes: 9 %
Neutrophils Absolute: 4.3 10*3/uL (ref 1.4–7.0)
Neutrophils: 69 %
Platelets: 297 10*3/uL (ref 150–450)
RBC: 4.77 x10E6/uL (ref 4.14–5.80)
RDW: 12.9 % (ref 11.6–15.4)
WBC: 6.3 10*3/uL (ref 3.4–10.8)

## 2021-04-29 LAB — HEPATIC FUNCTION PANEL
ALT: 33 IU/L (ref 0–44)
AST: 29 IU/L (ref 0–40)
Albumin: 4.5 g/dL (ref 3.8–4.9)
Alkaline Phosphatase: 123 IU/L — ABNORMAL HIGH (ref 44–121)
Bilirubin Total: 0.4 mg/dL (ref 0.0–1.2)
Bilirubin, Direct: 0.12 mg/dL (ref 0.00–0.40)
Total Protein: 6.8 g/dL (ref 6.0–8.5)

## 2021-05-02 ENCOUNTER — Encounter: Payer: Self-pay | Admitting: *Deleted

## 2021-05-19 ENCOUNTER — Ambulatory Visit: Payer: PRIVATE HEALTH INSURANCE | Admitting: Neurology

## 2021-08-03 ENCOUNTER — Other Ambulatory Visit: Payer: Self-pay

## 2021-08-03 ENCOUNTER — Other Ambulatory Visit: Payer: Self-pay | Admitting: Neurology

## 2021-08-03 MED ORDER — GABAPENTIN 600 MG PO TABS: 600.0000 mg | ORAL_TABLET | Freq: Three times a day (TID) | ORAL | 1 refills | Status: DC

## 2021-08-03 MED ORDER — GABAPENTIN 600 MG PO TABS: 600.0000 mg | ORAL_TABLET | Freq: Three times a day (TID) | ORAL | 0 refills | Status: DC

## 2021-08-03 NOTE — Progress Notes (Unsigned)
Last OV was on 04/28/21.  ?Next OV is scheduled for 11/02/21.  ?Last RX was written on 04/18/21 for 270 tabs.  ? ?Rx was printed by mistake. Pt lives in [place]. Pharmacy called and verified. OK to fill.  ?

## 2021-08-04 NOTE — Progress Notes (Signed)
Faxed printed/signed rx to pharmacy at [phone]. Received fax confirmation. ?

## 2021-08-07 ENCOUNTER — Other Ambulatory Visit: Payer: Self-pay | Admitting: Neurology

## 2021-08-10 ENCOUNTER — Other Ambulatory Visit: Payer: Self-pay | Admitting: Neurology

## 2021-09-05 ENCOUNTER — Other Ambulatory Visit: Payer: Self-pay | Admitting: Neurology

## 2021-10-19 LAB — COLOGUARD: COLOGUARD: NEGATIVE

## 2021-11-02 ENCOUNTER — Ambulatory Visit (INDEPENDENT_AMBULATORY_CARE_PROVIDER_SITE_OTHER): Payer: PRIVATE HEALTH INSURANCE | Admitting: Neurology

## 2021-11-02 ENCOUNTER — Encounter: Payer: Self-pay | Admitting: Neurology

## 2021-11-02 VITALS — BP 187/92 | HR 82 | Ht 72.0 in | Wt 228.5 lb

## 2021-11-02 DIAGNOSIS — R3915 Urgency of urination: Secondary | ICD-10-CM | POA: Diagnosis not present

## 2021-11-02 DIAGNOSIS — G35 Multiple sclerosis: Secondary | ICD-10-CM | POA: Diagnosis not present

## 2021-11-02 DIAGNOSIS — R269 Unspecified abnormalities of gait and mobility: Secondary | ICD-10-CM | POA: Diagnosis not present

## 2021-11-02 DIAGNOSIS — F419 Anxiety disorder, unspecified: Secondary | ICD-10-CM

## 2021-11-02 MED ORDER — DULOXETINE HCL 60 MG PO CPEP: 60.0000 mg | ORAL_CAPSULE | Freq: Every day | ORAL | 1 refills | Status: DC

## 2021-11-02 MED ORDER — TOLTERODINE TARTRATE 2 MG PO TABS: 2.0000 mg | ORAL_TABLET | Freq: Two times a day (BID) | ORAL | 1 refills | Status: DC

## 2021-11-02 MED ORDER — BACLOFEN 10 MG PO TABS
ORAL_TABLET | ORAL | 4 refills | Status: DC
Start: 2021-11-02 — End: 2022-05-08

## 2021-11-02 NOTE — Patient Instructions (Signed)
Check labs today  Monitor your BP at home  Let me know about physical therapy  For now continue current medications  6 months return with [name]

## 2021-11-02 NOTE — Progress Notes (Signed)
PATIENT: Timothy Love DOB: 1960-12-02  REASON FOR VISIT: Follow up for MS HISTORY FROM: Patient PRIMARY NEUROLOGIST: [name]   HISTORY OF PRESENT ILLNESS: Today 11/02/21 Timothy Love is here today for follow-up. Remains on Kesimpta.  Labs in January 2023 showed normal IgG, IgA, IgM, alkaline phosphatase was 123, CBC was normal.  Back in March, he fell in yard landed on metal edging, broke a vertebrae L1, he was sedentary for 11 weeks, he went to [place] [facility], Dr. Franky Macho, told heal overtime. During the time, lost mobility, still having pain to low back. Taking Tylenol, gabapentin. He gets stressed when he has to walk distances, increased BP.   He feels the MS has declined due to lack of mobility from L1 injury. Chronic weakness to right side feels worse. B/B doing okay. On Detrol. Doesn't want CPAP, doesn't feel unrested during the day. 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 He is considering disability.   He is going to [facility], they are working on natural pain relief for the back. He doesn't want any more medications.   Update 04/28/21 SS: Timothy Love is here today for follow-up with history of MS, is on Kesimpta, tolerates well, was previously on Ocrevus.    Had MRI of the brain and cervical spine in June 2022.   MRI of the brain 09/07/2020 showed some scattered T2/FLAIR hyperintense foci in the hemispheres and 1 subtle focus in the left medulla.  The overall plaque burden is low.  None of the foci appear to be acute.  Compared to the MRI from 2019, there were no definite new lesions.   MRI of the cervical spine 09/07/2020 showed T2 hyperintense foci to the right at C2-C3, to the right at C3-C4, centrally at C4-C5, to the left at C5 and to the right at C6.  This appears unchanged compared to the previous MRI from 03/21/2019.  Seeing someone in [place] who is a Land, doing some tens units work to the feet, left feels better, color is better.   He will take  higher dose baclofen and gabapentin at night if needed, but so far is doing well. Gets B 12 shot once a month, on Vitamin D supplement. Some fall, right foot gets hung.  He picked up CPAP machine. Didn't understand how to use it. At the time having terrible spasms, couldn't get comfortable for CPAP. Tried for 2 weeks, couldn't sleep, because couldn't turn over because it was making noises. He does electrical work. Doesn't want to try CPAP again right now.   HISTORY Update 08/11/2020 [name]: He is on Kesimpta and tolerates it well.   No significant skin reactions.  No infections.    He denies any exacerbations but gait is mildly worse.   He has a poor gait due to right leg weakness and spasticity.    He uses a cane.  He has also noted more difficulty with fine motor and gross motor use of the right hand.   He has right leg spasticity. He wakes up with spasms a lot.    He takes baclofen 10 mg po tid.    He has some dysesthesias, helped by gabapentin.  He switched to Lyrica early 2020 and feel it is no better.    He is sleepy durig the day.   He snores loudly at night and his wife sleeps in a different room.  She does not note OSA signs.    He has gained weight the last few years (since  starting Lyrica).  He is eating more as he is less active at work and around the house.    He has had some depression.   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  Due to reduced mobility, he was already limited to not use ladders or climbing more than a few feet.  He is right-hand dominant and the right hand has reduced fine motor control and reduced ability to do rapid alternating movements.  There is reduced strength in the right arm and leg.  He gets spasms when he sits for long time of the right leg.  Additionally, he fatigues easily.Marland Kitchen   He had Covid-21 December 2018.    He felt very tired a few days but had no severe issues.      MS History He was diagnosed with MS in 2013.  In April 2013 he noticed numbness that started in the feet and ascended to the waist line.   He also had pain in the thighs at the onset. Marland Kitchen  He also had mild weakness in the legs.  He had difficulty with his gait and also had urinary and bowel incontinence 20 2013.  He saw orthopedics and had MRI of the thoracic spine showing T2 lesions.  MRI of the brain in 2013 showed multiple T2/flair hyperintense foci including 1 in the right parieto-occipital lobe that had enhancement.  Initially, he was placed on the drip.  He was on [name] from January 2014 through 02/20/2013 and then switched to [facility] November 2014.  He continued on [name] until 02/20/2017 when he was switched to [place].   At the time, his right leg was doing worse though there was no definite exacerbation.  He has tolerated Ocrevus but is wondering about different therapeutic options.  Specifically, he would like to discuss Kesimpta.    REVIEW OF SYSTEMS: Out of a complete 14 system review of symptoms, the patient complains only of the following symptoms, and all other reviewed systems are negative.   See HPI  ALLERGIES: Allergies  Allergen Reactions   Penicillins Other (See Comments)    Child-doesn't know    HOME MEDICATIONS: Outpatient Medications Prior to  Visit  Medication Sig Dispense Refill   baclofen (LIORESAL) 10 MG tablet Take one po qAm, one po qPM and two po qHS 360 each 4   Cholecalciferol  (VITAMIN D PO) Take 5,000 Units by mouth daily.     clonazePAM (KLONOPIN) 0.5 MG tablet Take 1 tablet (0.5 mg total) by mouth at bedtime as needed for anxiety. 30 tablet 5   cyanocobalamin (,VITAMIN B-12,) 1000 MCG/ML injection Inject 1,000 mcg into the muscle every 30 (thirty) days.     DULoxetine (CYMBALTA) 60 MG capsule Take 1 capsule (60 mg total) by mouth daily. 90 capsule 1   FLUoxetine (PROZAC) 40 MG capsule 40 mg.     gabapentin (NEURONTIN) 600 MG tablet TAKE 1 TABLET BY MOUTH THREE TIMES DAILY 270 tablet 1   modafinil (PROVIGIL) 200 MG tablet Take 1 tablet (200 mg total) by mouth daily. 90 tablet 3   Ofatumumab (KESIMPTA) 20 MG/0.4ML SOAJ Inject 0.4 mLs into the skin every 30 (thirty) days. 1.2 mL 3   pravastatin (PRAVACHOL) 10 MG tablet Take 10 mg by mouth daily.     tolterodine (DETROL) 2 MG tablet Take 1 tablet by mouth twice daily 180 tablet 0   No facility-administered medications prior to visit.    PAST MEDICAL HISTORY: Past Medical History:  Diagnosis Date   Anxiety    Depression    Kidney stone    MS (multiple sclerosis) ([place])    Unspecified cause of encephalitis, myelitis, and encephalomyelitis     PAST SURGICAL HISTORY: History reviewed. No pertinent surgical history.  FAMILY HISTORY: Family History  Problem Relation Age of Onset   High Cholesterol Mother    Heart Problems Mother    Heart attack Father     SOCIAL HISTORY: Social History   Socioeconomic History   Marital status: Married    Spouse name: cynthia   Number of children: 2   Years of education: college   Highest education level: Not on file  Occupational History    Employer: [employer]  Tobacco Use   Smoking status: Never   Smokeless tobacco: Never  Substance and Sexual Activity   Alcohol use: No    Alcohol/week: 0.0 standard drinks of alcohol   Drug use: No   Sexual activity: Not on file  Other Topics Concern   Not on file  Social History Narrative   Not on file    Social Determinants of Health   Financial Resource Strain: Not on file  Food Insecurity: Not on file  Transportation Needs: Not on file  Physical Activity: Not on file  Stress: Not on file  Social Connections: Not on file  Intimate Partner Violence: Not on file   PHYSICAL EXAM  Vitals:   11/02/21 0836 11/02/21 0838  BP: (!) 173/100 (!) 187/92  Pulse: 85 82  Weight: 228 lb 8 oz (103.6 kg)   Height: 6' (1.829 m)     Body mass index is 30.99 kg/m.  Generalized: Well developed, in no acute distress   Neurological examination  Mentation: Alert oriented to time, place, history taking. Follows all commands speech and language fluent Cranial nerve II-XII: Pupils were equal round reactive to light. Extraocular movements were full, visual field were full on confrontational test. Facial sensation and strength were normal. Head turning and shoulder shrug  were normal and symmetric. Motor: Right upper extremity strength 4/5, right leg 3/5 with hip flexion, right foot drop Sensory: Sensory testing is intact to soft touch on all 4 extremities.  No evidence of extinction is noted.  Coordination: Finger-nose-finger bilaterally is okay, can't lift the right leg, it hurts Gait and station: Has to push off from seated position to stand, spastic type gait, right foot drop, uses a single-point cane, can take few steps independently in room, antalgic Reflexes: Deep tendon reflexes are symmetric but increased   DIAGNOSTIC DATA (LABS, IMAGING, TESTING) - I reviewed patient records, labs, notes, testing and imaging myself where available.  Lab Results  Component Value Date   WBC 6.3 04/28/2021   HGB 14.5 04/28/2021   HCT 42.9 04/28/2021   MCV 90 04/28/2021   PLT 297 04/28/2021      Component Value Date/Time   NA 140 03/17/2019 1407   K 4.3 03/17/2019 1407   CL 105 03/17/2019 1407   CO2 19 (L) 03/17/2019 1407   GLUCOSE 165 (H) 03/17/2019 1407   BUN 22 03/17/2019 1407   CREATININE 0.87  03/17/2019 1407   CALCIUM 9.5 03/17/2019 1407   PROT 6.8 04/28/2021 1500   ALBUMIN 4.5 04/28/2021 1500   AST 29 04/28/2021 1500   ALT 33 04/28/2021 1500   ALKPHOS 123 (H) 04/28/2021 1500   BILITOT 0.4 04/28/2021 1500   GFRNONAA 95 03/17/2019 1407   GFRAA 110 03/17/2019 1407   No results found for: "CHOL", "HDL", "LDLCALC", "LDLDIRECT", "TRIG", "CHOLHDL" No results found for: "HGBA1C" Lab Results  Component Value Date   VITAMINB12 201 (L) 02/01/2017   Lab Results  Component Value Date   TSH 0.950 11/28/2017   ASSESSMENT AND PLAN 61 y.o. year old male  has a past medical history of Anxiety, Depression, Kidney stone, MS (multiple sclerosis) ([facility]), and Unspecified cause of encephalitis, myelitis, and encephalomyelitis. here with:  1.  Multiple sclerosis 2.  Gait abnormality 3.  OSA  -Has had some decline since L1 fracture, he was sedentary for several weeks, feels this affected his mobility, has continued pain -Discussed PT to work on gait, muscle strengthening, defers for now, he will continue work with a local group [facility] -Continue Kesimpta, check labs -BP is elevated, needs to monitor at home, suspect pain, stress, contributing -Still not interested in CPAP, denies any symptoms of OSA, will continue to revisit -Is considering disability, is an Personnel officer by trade -MRI of the brain and cervical spine imaging was done in June 2022, was overall stable -Continue baclofen 01-11-19 mg daily  -Continue gabapentin 600 mg 3 times daily -Continue Detrol 2 mg twice daily -Follow-up in 6 months or sooner if needed with [name] for revisit   Margie Ege, AGNP-C, DNP 11/02/2021, 9:02 AM [facility] [address], [address] [place], [place] [address] [phone]

## 2021-11-03 LAB — CBC WITH DIFFERENTIAL/PLATELET
Basophils Absolute: 0.1 10*3/uL (ref 0.0–0.2)
Basos: 1 %
EOS (ABSOLUTE): 0.2 10*3/uL (ref 0.0–0.4)
Eos: 4 %
Hematocrit: 45.9 % (ref 37.5–51.0)
Hemoglobin: 15 g/dL (ref 13.0–17.7)
Immature Grans (Abs): 0 10*3/uL (ref 0.0–0.1)
Immature Granulocytes: 0 %
Lymphocytes Absolute: 0.9 10*3/uL (ref 0.7–3.1)
Lymphs: 15 %
MCH: 29.9 pg (ref 26.6–33.0)
MCHC: 32.7 g/dL (ref 31.5–35.7)
MCV: 92 fL (ref 79–97)
Monocytes Absolute: 0.5 10*3/uL (ref 0.1–0.9)
Monocytes: 9 %
Neutrophils Absolute: 4.2 10*3/uL (ref 1.4–7.0)
Neutrophils: 71 %
Platelets: 277 10*3/uL (ref 150–450)
RBC: 5.01 x10E6/uL (ref 4.14–5.80)
RDW: 12.6 % (ref 11.6–15.4)
WBC: 5.9 10*3/uL (ref 3.4–10.8)

## 2021-11-03 LAB — HEPATIC FUNCTION PANEL
ALT: 19 IU/L (ref 0–44)
AST: 17 IU/L (ref 0–40)
Albumin: 4.6 g/dL (ref 3.9–4.9)
Alkaline Phosphatase: 138 IU/L — ABNORMAL HIGH (ref 44–121)
Bilirubin Total: 0.3 mg/dL (ref 0.0–1.2)
Bilirubin, Direct: 0.1 mg/dL (ref 0.00–0.40)
Total Protein: 6.7 g/dL (ref 6.0–8.5)

## 2021-11-03 LAB — IGG, IGA, IGM
IgA/Immunoglobulin A, Serum: 104 mg/dL (ref 61–437)
IgG (Immunoglobin G), Serum: 615 mg/dL (ref 603–1613)
IgM (Immunoglobulin M), Srm: 42 mg/dL (ref 20–172)

## 2021-11-21 ENCOUNTER — Encounter: Payer: Self-pay | Admitting: Neurology

## 2022-01-01 ENCOUNTER — Other Ambulatory Visit: Payer: Self-pay | Admitting: Neurology

## 2022-03-17 ENCOUNTER — Other Ambulatory Visit: Payer: Self-pay | Admitting: Neurology

## 2022-04-07 ENCOUNTER — Telehealth: Payer: Self-pay | Admitting: Neurology

## 2022-04-07 NOTE — Telephone Encounter (Signed)
[employer] [facility] ([place]) Faxing a form for physician to fill out for assistance to help with copay for Ofatumumab (KESIMPTA) 20 MG/0.4ML SOAJ. Will be faxing to [phone] for physician fill out.

## 2022-04-10 NOTE — Telephone Encounter (Signed)
Will monitor for PW.

## 2022-04-18 NOTE — Telephone Encounter (Signed)
Received form. Waiting on MD signature

## 2022-04-19 NOTE — Telephone Encounter (Signed)
Faxed completed/signed prescriber portion of application to [employer] at [phone]. Received fax confirmation. 

## 2022-05-03 NOTE — Telephone Encounter (Signed)
Received fax asking for form to be resent to [employer] [facility] at [phone]. Faxed and received fax confirmation.

## 2022-05-04 NOTE — Telephone Encounter (Signed)
No, you can go ahead and send in the prescription

## 2022-05-04 NOTE — Telephone Encounter (Signed)
I called pt and we discussed I advised we had already sent Rx to [facility] and [employer] pt assistance.   Pt verbalized understanding and states rx for [place] is not needed at present.  He will call [employer] tomorrow to check on shipment status.

## 2022-05-04 NOTE — Telephone Encounter (Signed)
Pt said insurance has switched to a different pharmacy. Need Kesimpta prescription sent to [facility] Phone: [phone] Fax: [phone]

## 2022-05-08 ENCOUNTER — Encounter: Payer: Self-pay | Admitting: Neurology

## 2022-05-08 ENCOUNTER — Ambulatory Visit (INDEPENDENT_AMBULATORY_CARE_PROVIDER_SITE_OTHER): Payer: PRIVATE HEALTH INSURANCE | Admitting: Neurology

## 2022-05-08 VITALS — BP 165/89 | HR 71 | Ht 72.0 in | Wt 232.5 lb

## 2022-05-08 DIAGNOSIS — M21371 Foot drop, right foot: Secondary | ICD-10-CM

## 2022-05-08 DIAGNOSIS — G35 Multiple sclerosis: Secondary | ICD-10-CM | POA: Diagnosis not present

## 2022-05-08 DIAGNOSIS — Z79899 Other long term (current) drug therapy: Secondary | ICD-10-CM | POA: Diagnosis not present

## 2022-05-08 DIAGNOSIS — R269 Unspecified abnormalities of gait and mobility: Secondary | ICD-10-CM | POA: Diagnosis not present

## 2022-05-08 DIAGNOSIS — G35D Multiple sclerosis, unspecified: Secondary | ICD-10-CM

## 2022-05-08 DIAGNOSIS — G4733 Obstructive sleep apnea (adult) (pediatric): Secondary | ICD-10-CM

## 2022-05-08 DIAGNOSIS — R3915 Urgency of urination: Secondary | ICD-10-CM

## 2022-05-08 DIAGNOSIS — E559 Vitamin D deficiency, unspecified: Secondary | ICD-10-CM

## 2022-05-08 MED ORDER — BACLOFEN 10 MG PO TABS: ORAL_TABLET | ORAL | 4 refills | Status: DC

## 2022-05-08 NOTE — Progress Notes (Signed)
[facility]  PATIENT: Timothy Love DOB: 1960-07-06  REFERRING DOCTOR OR PCP: Levert Feinstein MD, PhD SOURCE: Patient, notes from Dr. Terrace Arabia, imaging and lab reports, MRI images personally reviewed.  _________________________________   HISTORICAL  CHIEF COMPLAINT:  Chief Complaint  Patient presents with   Follow-up    Pt in room 11 here for follow up MS. Patient reports having trouble walking, walks with cane. right hand gets weak. Patient reports in last 3 months 4 falls.    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   He needed to be less active for a coupe months and he felt he got deconditioned and gait has worsened some but no definite new symptom  He is on Kesimpta and tolerates it well.   No significant skin reactions.  No infections.    He denies any exacerbations but gait is mildly worse.   He has had issues getting the medication and   He has a poor gait due to right leg weakness and spasticity.    He uses a cane.  He has also noted more difficulty with fine motor and gross motor use of the right hand.   He has right leg spasticity. He wakes up with spasms a lot.    He takes baclofen 10 mg po tid.    He has some dysesthesias, helped by gabapentin.  He switched to Lyrica early 2020 and feel it is no better.   He is sleepy durig the day.   He has severe OSA AHI=57.9  but has not been able to tolerate APAP.     Uncertain whether he had a seizure as he had two episodes of right sided shaking while weaning off prednisone.    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  Due to reduced mobility, he was already limited to not use ladders or climbing more than a few feet.  He is right-hand dominant and the right hand has reduced fine motor control and reduced ability to do rapid alternating movements.  There is reduced strength in the right arm and leg.  He gets spasms when he sits for long time of the right leg.  Additionally, he fatigues easily.Marland Kitchen  He had Covid-21 December 2018.    He felt very tired a few days but had no severe issues.      MS History He was diagnosed with MS in 2013.  In April 2013 he noticed numbness that started in the feet and ascended to the waist line.   He also had pain in the thighs at the onset. Marland Kitchen  He also had mild weakness in the legs.  He had difficulty with his gait and also had urinary and bowel incontinence 20 2013.  He saw orthopedics and had MRI of the thoracic spine showing T2 lesions.  MRI  of the brain in 2013 showed multiple T2/flair hyperintense foci including 1 in the right parieto-occipital lobe that had enhancement.  Initially, he was placed on the drip.  He was on [name] from January 2014 through 02/20/2013 and then switched to [facility] November 2014.  He continued on [name] until 02/20/2017 when he was switched to [place].   At the time, his right leg was doing worse though there was no definite exacerbation.  He has tolerated Ocrevus but is wondering about different therapeutic options.  Specifically, he would like to discuss Kesimpta.   Imaging review MRI of the brain 09/07/2020 showed some scattered T2/FLAIR hyperintense foci in the hemispheres and 1 subtle focus in the left medulla.  The overall plaque burden is low.  None of the foci appear to be acute.  Compared to the MRI from 2019, there were no definite new lesions.  MRI of the cervical spine 09/07/2020 showed T2 hyperintense foci to the  right at C2-C3, to the right at C3-C4, centrally at C4-C5, to the left at C5 and to the right at C6.  This appears unchanged compared to the previous MRI from 03/21/2019.  MRI of the cervical spine 10/08/2015 showed foci to the right at C3, centrally at C4, anterolaterally to the left at C4-C5 and to the right at C5-C6.  MRI of the thoracic spine 10/08/2015 showed a focus to the right at T1-T2 a larger focus to the left at T7-T8 and a central focus at T9.  Additionally, there are endplate chronic fractures of T7 and T10.  Laboratory review: CSF November 2013 showed more than 5 oligoclonal bands.  Visual evoked potential showed prolonged P100 latencies on the left consistent with history of left optic neuritis.  Sleep study 04/07/2020 obstructive sleep apnea with an AHI of 57.9   REVIEW OF SYSTEMS: Constitutional: No fevers, chills, sweats, or change in appetite.    He has fatigue and sleepiness.   Eyes: No visual changes, double vision, eye pain Ear, nose and throat: No hearing loss, ear pain, nasal congestion, sore throat Cardiovascular: No chest pain, palpitations Respiratory:  No shortness of breath at rest or with exertion.   No wheezes.  He snores. GastrointestinaI: No nausea, vomiting, diarrhea, abdominal pain, fecal incontinence Genitourinary:  No dysuria, urinary retention or frequency.  No nocturia. Musculoskeletal:  No neck pain, back pain.  Pain in the right leg from spasticity Integumentary: No rash, pruritus, skin lesions Neurological: as above Psychiatric: No depression at this time.  No anxiety Endocrine: No palpitations, diaphoresis, change in appetite, change in weigh or increased thirst Hematologic/Lymphatic:  No anemia, purpura, petechiae. Allergic/Immunologic: No itchy/runny eyes, nasal congestion, recent allergic reactions, rashes  ALLERGIES: Allergies  Allergen Reactions   Penicillins Other (See Comments)    Child-doesn't know    HOME MEDICATIONS:  Current  Outpatient Medications:    alendronate (FOSAMAX) 70 MG tablet, Take 70 mg by mouth once a week. Take with a full glass of water on an empty stomach., Disp: , Rfl:    baclofen (LIORESAL) 10 MG tablet, Take one po qAm, one po qPM and two po qHS, Disp: 360 each, Rfl: 4   Cholecalciferol (VITAMIN D PO), Take 5,000 Units by mouth daily., Disp: , Rfl:    clonazePAM (KLONOPIN) 0.5 MG tablet, Take 1 tablet (0.5 mg total) by mouth at bedtime as needed for anxiety., Disp: 30 tablet, Rfl: 5   cyanocobalamin (,VITAMIN B-12,) 1000 MCG/ML injection, Inject 1,000 mcg into the muscle every 30 (thirty) days., Disp: ,  Rfl:    DULoxetine (CYMBALTA) 60 MG capsule, Take 1 capsule by mouth once daily, Disp: 90 capsule, Rfl: 0   FLUoxetine (PROZAC) 40 MG capsule, 40 mg., Disp: , Rfl:    gabapentin (NEURONTIN) 600 MG tablet, TAKE 1 TABLET BY MOUTH THREE TIMES DAILY, Disp: 270 tablet, Rfl: 1   modafinil (PROVIGIL) 200 MG tablet, Take 1 tablet (200 mg total) by mouth daily., Disp: 90 tablet, Rfl: 3   Ofatumumab (KESIMPTA) 20 MG/0.4ML SOAJ, Inject 0.4 mLs into the skin every 30 (thirty) days., Disp: 1.2 mL, Rfl: 3   pravastatin (PRAVACHOL) 10 MG tablet, Take 10 mg by mouth daily., Disp: , Rfl:    tolterodine (DETROL) 2 MG tablet, Take 1 tablet (2 mg total) by mouth 2 (two) times daily., Disp: 180 tablet, Rfl: 1  PAST MEDICAL HISTORY: Past Medical History:  Diagnosis Date   Anxiety    Depression    Kidney stone    MS (multiple sclerosis) ([place])    Unspecified cause of encephalitis, myelitis, and encephalomyelitis     PAST SURGICAL HISTORY: No past surgical history on file.  FAMILY HISTORY: Family History  Problem Relation Age of Onset   High Cholesterol Mother    Heart Problems Mother    Heart attack Father     SOCIAL HISTORY:  Social History   Socioeconomic History   Marital status: Married    Spouse name: cynthia   Number of children: 2   Years of education: college   Highest education level: Not on  file  Occupational History    Employer: [employer]  Tobacco Use   Smoking status: Never   Smokeless tobacco: Never  Substance and Sexual Activity   Alcohol use: No    Alcohol/week: 0.0 standard drinks of alcohol   Drug use: No   Sexual activity: Not on file  Other Topics Concern   Not on file  Social History Narrative   Not on file   Social Determinants of Health   Financial Resource Strain: Not on file  Food Insecurity: Not on file  Transportation Needs: Not on file  Physical Activity: Not on file  Stress: Not on file  Social Connections: Not on file  Intimate Partner Violence: Not on file     PHYSICAL EXAM  Vitals:   05/08/22 1102  BP: (!) 165/89  Pulse: 71  Weight: 232 lb 8 oz (105.5 kg)  Height: 6' (1.829 m)    Body mass index is 31.53 kg/m.   General: The patient is well-developed and well-nourished and in no acute distress.  Pharynx is Mallampati 2  HEENT:  Head is Calib/AT.  Sclera are anicteric.     Skin: Extremities are without rash or  edema.  Musculoskeletal:  Back is nontender  Neurologic Exam  Mental status: The patient is alert and oriented x 3 at the time of the examination. The patient has apparent normal recent and remote memory, with an apparently normal attention span and concentration ability.   Speech is normal.  Cranial nerves: Extraocular movements are full. Facial strength and sensation.  No obvious hearing deficits are noted.  Motor:  Muscle bulk is normal.   Tone is increased on the right, leg more than arm.. Strength is 4+to 5/5 in the right arm and hand.  He has reduced rapid altering movements in the hand.  Strength is 4/5 in the right leg  Sensory: He has reduced vibration sensation in the right leg.  Normal touch sensation.  Coordination: He  has reduced finger-nose-finger in the right arm.  He cannot do heel-to-shin with the right leg.  Gait and station: Station is fairly normal though he needs to use his hands to  stand up from the chair.   He has a spastic gait with a reduced stride.  He has a right foot drop.  He is unable to do a tandem walk.  Romberg is negative.   Reflexes: Deep tendon reflexes are asymmetrically increased in the legs, with crossed abductor responses at the right knee.  Reflexes were more symmetric in the arm.   Plantar responses are flexor.    DIAGNOSTIC DATA (LABS, IMAGING, TESTING) - I reviewed patient records, labs, notes, testing and imaging myself where available.  Lab Results  Component Value Date   WBC 5.9 11/02/2021   HGB 15.0 11/02/2021   HCT 45.9 11/02/2021   MCV 92 11/02/2021   PLT 277 11/02/2021      Component Value Date/Time   NA 140 03/17/2019 1407   K 4.3 03/17/2019 1407   CL 105 03/17/2019 1407   CO2 19 (L) 03/17/2019 1407   GLUCOSE 165 (H) 03/17/2019 1407   BUN 22 03/17/2019 1407   CREATININE 0.87 03/17/2019 1407   CALCIUM 9.5 03/17/2019 1407   PROT 6.7 11/02/2021 0924   ALBUMIN 4.6 11/02/2021 0924   AST 17 11/02/2021 0924   ALT 19 11/02/2021 0924   ALKPHOS 138 (H) 11/02/2021 0924   BILITOT 0.3 11/02/2021 0924   GFRNONAA 95 03/17/2019 1407   GFRAA 110 03/17/2019 1407   No results found for: "CHOL", "HDL", "LDLCALC", "LDLDIRECT", "TRIG", "CHOLHDL" No results found for: "HGBA1C" Lab Results  Component Value Date   VITAMINB12 201 (L) 02/01/2017   Lab Results  Component Value Date   TSH 0.950 11/28/2017       ASSESSMENT AND PLAN  MS (multiple sclerosis) ([place]) - Plan: Comprehensive metabolic panel, IgG, IgA, IgM, CBC with Differential/Platelet, Ambulatory referral to Physical Therapy  High risk medication use - Plan: Comprehensive metabolic panel, IgG, IgA, IgM, CBC with Differential/Platelet  Gait abnormality - Plan: Ambulatory referral to Physical Therapy  Right foot drop - Plan: Ambulatory referral to Physical Therapy  Urinary urgency  OSA (obstructive sleep apnea)   1.   Continue Kesimpta.   Check labs.   Check MRI brain  and cervical spine 2.   We discussed his gait issues likely deconditioning rather than another exacerbation.   Will request PT in [place] and consider Ampyra if not better in a couple months (check renal functin).  The event felt to possibly be a seizure was more likely to be spasm related.    3.   Continue baclofen for spasticity.    He also has dysesthesias.  Continue gabapentin for dysesthesias 3.    Could not tolerate APAP 5-20 for severe OSA.  We discussed weight loss 4.    RTC 6 months      Khady Vandenberg A. Felecia Shelling, MD, [facility] 06/09/298, 92:33 AM Certified in Neurology, Clinical Neurophysiology, Sleep Medicine and Neuroimaging  [facility] [address] [place], [place] [address] [phone]

## 2022-05-09 ENCOUNTER — Telehealth: Payer: Self-pay | Admitting: Neurology

## 2022-05-09 LAB — CBC WITH DIFFERENTIAL/PLATELET
Basophils Absolute: 0.1 10*3/uL (ref 0.0–0.2)
Basos: 1 %
EOS (ABSOLUTE): 0.2 10*3/uL (ref 0.0–0.4)
Eos: 3 %
Hematocrit: 45.8 % (ref 37.5–51.0)
Hemoglobin: 14.9 g/dL (ref 13.0–17.7)
Immature Grans (Abs): 0 10*3/uL (ref 0.0–0.1)
Immature Granulocytes: 0 %
Lymphocytes Absolute: 1.2 10*3/uL (ref 0.7–3.1)
Lymphs: 18 %
MCH: 29.4 pg (ref 26.6–33.0)
MCHC: 32.5 g/dL (ref 31.5–35.7)
MCV: 90 fL (ref 79–97)
Monocytes Absolute: 0.7 10*3/uL (ref 0.1–0.9)
Monocytes: 10 %
Neutrophils Absolute: 4.6 10*3/uL (ref 1.4–7.0)
Neutrophils: 68 %
Platelets: 331 10*3/uL (ref 150–450)
RBC: 5.07 x10E6/uL (ref 4.14–5.80)
RDW: 12.6 % (ref 11.6–15.4)
WBC: 6.7 10*3/uL (ref 3.4–10.8)

## 2022-05-09 LAB — IGG, IGA, IGM
IgA/Immunoglobulin A, Serum: 103 mg/dL (ref 61–437)
IgG (Immunoglobin G), Serum: 653 mg/dL (ref 603–1613)
IgM (Immunoglobulin M), Srm: 42 mg/dL (ref 20–172)

## 2022-05-09 LAB — COMPREHENSIVE METABOLIC PANEL
ALT: 39 IU/L (ref 0–44)
AST: 27 IU/L (ref 0–40)
Albumin/Globulin Ratio: 2.5 — ABNORMAL HIGH (ref 1.2–2.2)
Albumin: 4.7 g/dL (ref 3.9–4.9)
Alkaline Phosphatase: 117 IU/L (ref 44–121)
BUN/Creatinine Ratio: 22 (ref 10–24)
BUN: 17 mg/dL (ref 8–27)
Bilirubin Total: 0.4 mg/dL (ref 0.0–1.2)
CO2: 24 mmol/L (ref 20–29)
Calcium: 10.5 mg/dL — ABNORMAL HIGH (ref 8.6–10.2)
Chloride: 105 mmol/L (ref 96–106)
Creatinine, Ser: 0.76 mg/dL (ref 0.76–1.27)
Globulin, Total: 1.9 g/dL (ref 1.5–4.5)
Glucose: 84 mg/dL (ref 70–99)
Potassium: 4.6 mmol/L (ref 3.5–5.2)
Sodium: 144 mmol/L (ref 134–144)
Total Protein: 6.6 g/dL (ref 6.0–8.5)
eGFR: 102 mL/min/{1.73_m2} (ref >59)

## 2022-05-09 LAB — VITAMIN D 25 HYDROXY (VIT D DEFICIENCY, FRACTURES): Vit D, 25-Hydroxy: 37.9 ng/mL (ref 30.0–100.0)

## 2022-05-09 NOTE — Telephone Encounter (Signed)
Referral for physical therapy fax to [facility] in [place] as requested. Phone: [phone], Fax: [phone]

## 2022-05-15 DIAGNOSIS — G35 Multiple sclerosis: Secondary | ICD-10-CM

## 2022-05-15 NOTE — Telephone Encounter (Signed)
Meribeth Mattes is calling from [employer]. Stated you will have to go threw Kestimpta first. P) [phone]

## 2022-05-15 NOTE — Telephone Encounter (Signed)
You should have paperwork I gave you for what was faxed

## 2022-05-15 NOTE — Telephone Encounter (Signed)
I have the paper work sent to both [employer] and [employer].  I have reach out to the pt to receive update on this.

## 2022-05-15 NOTE — Telephone Encounter (Signed)
The patient told [facility] that he is not ready to start PT yet and he will call them to schedule when he is ready.

## 2022-05-16 MED ORDER — KESIMPTA 20 MG/0.4ML ~~LOC~~ SOAJ: 0.4000 mL | SUBCUTANEOUS | 3 refills | Status: DC

## 2022-05-23 NOTE — Telephone Encounter (Signed)
Received fax from [facility] that Timothy Love will be delivered to patient 05/24/22.

## 2022-05-31 ENCOUNTER — Telehealth: Payer: Self-pay | Admitting: Neurology

## 2022-05-31 NOTE — Telephone Encounter (Signed)
Mya @ [facility] is asking that [facility] be called at [phone] for clarification of Rx on pt's [facility]

## 2022-05-31 NOTE — Telephone Encounter (Signed)
Called back. Since pt approved for patient assistance via [employer], he is going to fill Kesimpta via [facility] (free drug program). Provided VO for Kesimpta #1pen/30days, 11 refills. Nothing further needed.

## 2022-06-19 NOTE — Telephone Encounter (Signed)
Timothy Love is calling from [employer] stated she need a prescription for Kesimpta please call [phone] option 3.

## 2022-06-19 NOTE — Telephone Encounter (Signed)
Pt filling through [employer]/pt assistance foundation.

## 2022-07-20 ENCOUNTER — Telehealth: Payer: Self-pay | Admitting: Neurology

## 2022-07-20 DIAGNOSIS — G35 Multiple sclerosis: Secondary | ICD-10-CM

## 2022-07-20 MED ORDER — KESIMPTA 20 MG/0.4ML ~~LOC~~ SOAJ: 0.4000 mL | SUBCUTANEOUS | 3 refills | Status: DC

## 2022-07-20 NOTE — Telephone Encounter (Signed)
Called pt. Informed him of message nurse [facility] sent. Pt said okay thank you.

## 2022-07-20 NOTE — Telephone Encounter (Signed)
Pt states that he has been informed that [facility] Kesimpta Patient Assistance is asking for a new script be sent to them for the  Ofatumumab [facility]) 20 MG/0.4ML SOAJ , pt has provided their fax# [phone] Pt states that he has been told he is coverd for the medication until the end of December '24

## 2022-07-20 NOTE — Telephone Encounter (Signed)
Called [facility] at [phone]. Spoke w/ Jacki Cones. They filled Kesimpta for pt 06/15/22 for a one time fill. Since pt now approved via [employer] pt assistance, he needs to fill with their pharmacy, [facility].  Advised me to call [phone] [employer] pt assistance foundation). I called and spoke w/ Janey Greaser. Transferred me to [facility] ([employer]). She transferred me to [facility] phramacy and asked me to choose option 3. I spoke w/ Tom/pharmacist. Provided VO for Kesimpta 20mg /0.50ml, directions: 0.1ml SQ q 30 days. #1.87ml, 3 refills. They will expedite and call pt to set up shipment.

## 2022-07-20 NOTE — Addendum Note (Signed)
Addended by: Arther Abbott on: 07/20/2022 10:21 AM   Modules accepted: Orders

## 2022-08-07 ENCOUNTER — Other Ambulatory Visit: Payer: Self-pay | Admitting: Neurology

## 2022-11-09 ENCOUNTER — Other Ambulatory Visit: Payer: Self-pay | Admitting: Neurology

## 2022-11-21 ENCOUNTER — Encounter: Payer: Self-pay | Admitting: Neurology

## 2022-11-21 ENCOUNTER — Ambulatory Visit (INDEPENDENT_AMBULATORY_CARE_PROVIDER_SITE_OTHER): Payer: PRIVATE HEALTH INSURANCE | Admitting: Neurology

## 2022-11-21 VITALS — BP 165/98 | HR 88 | Ht 72.0 in | Wt 236.0 lb

## 2022-11-21 DIAGNOSIS — G35 Multiple sclerosis: Secondary | ICD-10-CM

## 2022-11-21 DIAGNOSIS — R269 Unspecified abnormalities of gait and mobility: Secondary | ICD-10-CM | POA: Diagnosis not present

## 2022-11-21 MED ORDER — TOLTERODINE TARTRATE 2 MG PO TABS: 2.0000 mg | ORAL_TABLET | Freq: Two times a day (BID) | ORAL | 3 refills | Status: DC

## 2022-11-21 MED ORDER — GABAPENTIN 600 MG PO TABS: 600.0000 mg | ORAL_TABLET | Freq: Three times a day (TID) | ORAL | 1 refills | Status: DC

## 2022-11-21 MED ORDER — DULOXETINE HCL 60 MG PO CPEP: 60.0000 mg | ORAL_CAPSULE | Freq: Every day | ORAL | 1 refills | Status: DC

## 2022-11-21 NOTE — Patient Instructions (Addendum)
Check MRI imaging brain and cervical spine, check labs today, if labs allow start Ampyra for gait, review info attached   Meds ordered this encounter  Medications   tolterodine (DETROL) 2 MG tablet    Sig: Take 1 tablet (2 mg total) by mouth 2 (two) times daily.    Dispense:  180 tablet    Refill:  3   gabapentin (NEURONTIN) 600 MG tablet    Sig: Take 1 tablet (600 mg total) by mouth 3 (three) times daily.    Dispense:  270 tablet    Refill:  1   DULoxetine (CYMBALTA) 60 MG capsule    Sig: Take 1 capsule (60 mg total) by mouth daily.    Dispense:  90 capsule    Refill:  1

## 2022-11-21 NOTE — Progress Notes (Signed)
Patient: Timothy Love Date of Birth: Sep 18, 1960  Reason for Visit: Follow up History from: Patient Primary Neurologist: Timothy Love  ASSESSMENT AND PLAN 62 y.o. year old male   1.  Multiple sclerosis 2.  Gait abnormality 3.  OSA  -Continue Kesimpta, some progression, feels increased weakness to right leg -Check labs today, CBC, CMP, IGG IGA IGM -Check MRI brain, cervical spine with and without contrast -Check kidney function, if satisfactory, plan to start Ampyra 10 mg twice daily  -Continue chronic medications: Gabapentin, Cymbalta, Baclofen, Detrol  -Has not been able to tolerate CPAP for severe OSA -Follow up in 6 months with Dr. Epimenio Love will rotate with me  HISTORY OF PRESENT ILLNESS: Today 11/21/22 Saw Dr. Epimenio Love February 2024.  Normal IgG IgA IgM.  Vitamin D37.9.  Ordered MRI brain and cervical spine.  Referred to PT.  He is retired. Remains on Kesimpta. No issues with injection. Feels MS slowly worsening since not working. Right leg is feeling weaker. Using cane, unsteady with walking, no falls for few months. He didn't do the PT Timothy Love ordered, didn't feel it would help. Not sure MRI was ordered. On Baclofen 10 mg BID, right leg spasms better with stretching. On Cymbalta 60 mg daily. PCP refills modafinil. Takes Detrol 2 mg BID for bladder. Gabapentin 600 mg TID.for pain lower back and right leg.  Vision is fine. Right arm feels slightly weak at times, grip isn't as strong. Urinary urgency, no issue with incontinence.  Drove down here.   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   He needed to be less active for a coupe months and he felt he got deconditioned and gait has worsened some but no definite new symptom   He is on Kesimpta and tolerates it well.   No significant skin reactions.  No infections.    He denies any exacerbations but gait is mildly worse.   He has had issues getting the medication and    He has a poor gait due to right  leg weakness and spasticity.    He uses a cane.  He has also noted more difficulty with fine motor and gross motor use of the right hand.   He has right leg spasticity. He wakes up with spasms a lot.    He takes baclofen 10 mg po tid.    He has some dysesthesias, helped by gabapentin.  He switched to Lyrica early 2020 and feel it is no better.    He is sleepy durig the day.   He has severe OSA AHI=57.9  but has not been able to tolerate APAP.      Uncertain whether he had a seizure as he had two episodes of right sided shaking while weaning off prednisone.    REVIEW OF SYSTEMS: Out of a complete 14 system review of symptoms, the patient complains only of the following symptoms, and all other reviewed systems are negative.  See HPI  ALLERGIES: Allergies  Allergen Reactions   Penicillins Other (See Comments)    Child-doesn't know  Other Reaction(s): Not available    HOME MEDICATIONS: Outpatient Medications Prior to Visit  Medication Sig Dispense Refill   alendronate (FOSAMAX) 70 MG tablet Take 70 mg by mouth once a week. Take with a full glass of water on an empty stomach.     baclofen (LIORESAL) 10 MG tablet Take one po qAm, one po qPM and two po qHS 360 each 4  Cholecalciferol (VITAMIN D PO) Take 5,000 Units by mouth daily.     clonazePAM (KLONOPIN) 0.5 MG tablet Take 1 tablet (0.5 mg total) by mouth at bedtime as needed for anxiety. 30 tablet 5   cyanocobalamin (,VITAMIN B-12,) 1000 MCG/ML injection Inject 1,000 mcg into the muscle every 30 (thirty) days.     FLUoxetine (PROZAC) 40 MG capsule 40 mg.     KESIMPTA 20 MG/0.4ML SOAJ Inject 0.4 mLs into the skin every 30 (thirty) days. 1.2 mL 3   losartan (COZAAR) 25 MG tablet Take 25 mg by mouth daily.     modafinil (PROVIGIL) 200 MG tablet Take 1 tablet (200 mg total) by mouth daily. 90 tablet 3   pravastatin (PRAVACHOL) 10 MG tablet Take 10 mg by mouth daily.     DULoxetine (CYMBALTA) 60 MG capsule Take 1 capsule by mouth once daily  90 capsule 0   gabapentin (NEURONTIN) 600 MG tablet TAKE 1 TABLET BY MOUTH THREE TIMES DAILY 270 tablet 1   tolterodine (DETROL) 2 MG tablet Take 1 tablet by mouth twice daily 180 tablet 0   No facility-administered medications prior to visit.    PAST MEDICAL HISTORY: Past Medical History:  Diagnosis Date   Anxiety    Depression    Kidney stone    MS (multiple sclerosis) (HCC)    Unspecified cause of encephalitis, myelitis, and encephalomyelitis     PAST SURGICAL HISTORY: History reviewed. No pertinent surgical history.  FAMILY HISTORY: Family History  Problem Relation Age of Onset   High Cholesterol Mother    Heart Problems Mother    Heart attack Father     SOCIAL HISTORY: Social History   Socioeconomic History   Marital status: Married    Spouse name: Timothy Love   Number of children: 2   Years of education: college   Highest education level: Some college, no degree  Occupational History    Employer: [employer]  Tobacco Use   Smoking status: Never   Smokeless tobacco: Never  Vaping Use   Vaping status: Never Used  Substance and Sexual Activity   Alcohol use: No    Alcohol/week: 0.0 standard drinks of alcohol   Drug use: No   Sexual activity: Not Currently  Other Topics Concern   Not on file  Social History Narrative   Not on file   Social Determinants of Health   Financial Resource Strain: Not on file  Food Insecurity: Not on file  Transportation Needs: Not on file  Physical Activity: Not on file  Stress: Not on file  Social Connections: Not on file  Intimate Partner Violence: Not on file   PHYSICAL EXAM  Vitals:   11/21/22 1058 11/21/22 1104  BP: (!) 187/84 (!) 165/98  Pulse: 88   Weight: 236 lb (107 kg)   Height: 6' (1.829 m)    Body mass index is 32.01 kg/m.  Generalized: Well developed, in no acute distress  Neurological examination  Mentation: Alert oriented to time, place, history taking. Follows all commands speech and  language fluent. Very pleasant.  Cranial nerve II-XII: Pupils were equal round reactive to light. Extraocular movements were full, visual field were full on confrontational test. Facial sensation and strength were normal. Head turning and shoulder shrug  were normal and symmetric. Motor: Good strength of all extremities, right arm 4/5, 3/5 right leg, decreased right hand grip, right Love drop Sensory: Sensory testing is intact to soft touch on all 4 extremities. No evidence of extinction is  noted.  Coordination: Cerebellar testing reveals good finger-nose-finger bilaterally, heel to shin with the left, cannot do with the right leg Gait and station: Drags right leg, uses cane  Reflexes: Deep tendon reflexes are symmetric are increased in the legs  DIAGNOSTIC DATA (LABS, IMAGING, TESTING) - I reviewed patient records, labs, notes, testing and imaging myself where available.  Lab Results  Component Value Date   WBC 6.7 05/08/2022   HGB 14.9 05/08/2022   HCT 45.8 05/08/2022   MCV 90 05/08/2022   PLT 331 05/08/2022      Component Value Date/Time   NA 144 05/08/2022 1408   K 4.6 05/08/2022 1408   CL 105 05/08/2022 1408   CO2 24 05/08/2022 1408   GLUCOSE 84 05/08/2022 1408   BUN 17 05/08/2022 1408   CREATININE 0.76 05/08/2022 1408   CALCIUM 10.5 (H) 05/08/2022 1408   PROT 6.6 05/08/2022 1408   ALBUMIN 4.7 05/08/2022 1408   AST 27 05/08/2022 1408   ALT 39 05/08/2022 1408   ALKPHOS 117 05/08/2022 1408   BILITOT 0.4 05/08/2022 1408   GFRNONAA 95 03/17/2019 1407   GFRAA 110 03/17/2019 1407   No results found for: "CHOL", "HDL", "LDLCALC", "LDLDIRECT", "TRIG", "CHOLHDL" No results found for: "HGBA1C" Lab Results  Component Value Date   VITAMINB12 201 (L) 02/01/2017   Lab Results  Component Value Date   TSH 0.950 11/28/2017    Margie Ege, AGNP-C, DNP 11/21/2022, 12:25 PM [facility] [address] [place], [place] [address] [phone]

## 2022-11-22 ENCOUNTER — Telehealth: Payer: Self-pay | Admitting: Neurology

## 2022-11-22 LAB — COMPREHENSIVE METABOLIC PANEL
ALT: 36 IU/L (ref 0–44)
AST: 27 IU/L (ref 0–40)
Albumin: 4.8 g/dL (ref 3.9–4.9)
Alkaline Phosphatase: 113 IU/L (ref 44–121)
BUN/Creatinine Ratio: 22 (ref 10–24)
BUN: 17 mg/dL (ref 8–27)
Bilirubin Total: 0.5 mg/dL (ref 0.0–1.2)
CO2: 25 mmol/L (ref 20–29)
Calcium: 9.8 mg/dL (ref 8.6–10.2)
Chloride: 103 mmol/L (ref 96–106)
Creatinine, Ser: 0.76 mg/dL (ref 0.76–1.27)
Globulin, Total: 1.9 g/dL (ref 1.5–4.5)
Glucose: 88 mg/dL (ref 70–99)
Potassium: 4.7 mmol/L (ref 3.5–5.2)
Sodium: 143 mmol/L (ref 134–144)
Total Protein: 6.7 g/dL (ref 6.0–8.5)
eGFR: 102 mL/min/{1.73_m2} (ref >59)

## 2022-11-22 LAB — IGG, IGA, IGM
IgA/Immunoglobulin A, Serum: 92 mg/dL (ref 61–437)
IgG (Immunoglobin G), Serum: 630 mg/dL (ref 603–1613)
IgM (Immunoglobulin M), Srm: 39 mg/dL (ref 20–172)

## 2022-11-22 LAB — CBC WITH DIFFERENTIAL/PLATELET
Basophils Absolute: 0.1 10*3/uL (ref 0.0–0.2)
Basos: 1 %
EOS (ABSOLUTE): 0.3 10*3/uL (ref 0.0–0.4)
Eos: 4 %
Hematocrit: 44.9 % (ref 37.5–51.0)
Hemoglobin: 14.9 g/dL (ref 13.0–17.7)
Immature Grans (Abs): 0 10*3/uL (ref 0.0–0.1)
Immature Granulocytes: 0 %
Lymphocytes Absolute: 1.2 10*3/uL (ref 0.7–3.1)
Lymphs: 17 %
MCH: 29.9 pg (ref 26.6–33.0)
MCHC: 33.2 g/dL (ref 31.5–35.7)
MCV: 90 fL (ref 79–97)
Monocytes Absolute: 0.6 10*3/uL (ref 0.1–0.9)
Monocytes: 9 %
Neutrophils Absolute: 5 10*3/uL (ref 1.4–7.0)
Neutrophils: 69 %
Platelets: 300 10*3/uL (ref 150–450)
RBC: 4.98 x10E6/uL (ref 4.14–5.80)
RDW: 12.6 % (ref 11.6–15.4)
WBC: 7.3 10*3/uL (ref 3.4–10.8)

## 2022-11-22 MED ORDER — DALFAMPRIDINE ER 10 MG PO TB12: 10.0000 mg | ORAL_TABLET | Freq: Two times a day (BID) | ORAL | 5 refills | Status: DC

## 2022-11-22 NOTE — Telephone Encounter (Signed)
I called the patient.  Blood work is normal.  Will go ahead and start Ampyra 10 mg twice daily for gait.  Reviewed side effects, no known history of seizures.  He is not taking tramadol or Wellbutrin. He should take 12 hours apart. Creatinine is normal 0.76.   Meds ordered this encounter  Medications   dalfampridine (AMPYRA) 10 MG TB12    Sig: Take 1 tablet (10 mg total) by mouth in the morning and at bedtime.    Dispense:  60 tablet    Refill:  5

## 2022-11-29 ENCOUNTER — Telehealth: Payer: Self-pay | Admitting: Neurology

## 2022-11-29 NOTE — Telephone Encounter (Signed)
[facility] auth: [id number] exp. 11/28/22-12/29/22 sent to [facility] [phone]  Spoke to Lurena Joiner at [employer].

## 2022-11-30 NOTE — Telephone Encounter (Signed)
I left a voice mail for Lurena Joiner at [facility] to call me back so we can change the location on the PA to [place].

## 2022-11-30 NOTE — Telephone Encounter (Addendum)
Pt requesting MRI referral sent to [facility] in [place]. {Phone: [phone]

## 2022-12-07 ENCOUNTER — Other Ambulatory Visit (HOSPITAL_COMMUNITY): Payer: Self-pay

## 2022-12-07 ENCOUNTER — Telehealth: Payer: Self-pay

## 2022-12-07 ENCOUNTER — Encounter: Payer: Self-pay | Admitting: Neurology

## 2022-12-07 ENCOUNTER — Telehealth: Payer: Self-pay | Admitting: *Deleted

## 2022-12-07 NOTE — Telephone Encounter (Signed)
Lurena Joiner called me back and said the location has been changed on the PA. I faxed the orders to [facility].[phone]

## 2022-12-07 NOTE — Telephone Encounter (Signed)
   I also tried to click "Other" and typed in the ICD 10 for Gait abnormality and it still brought up this exact question above. Please advise-

## 2022-12-07 NOTE — Telephone Encounter (Signed)
I think PA may be needed for Dalfampridine. Can you check into this? Need for gait asap.

## 2022-12-08 NOTE — Telephone Encounter (Signed)
PA request has been  pending additional tests/information . New Encounter created for follow up. For additional info see Pharmacy Prior Auth telephone encounter from 09/05.

## 2022-12-11 NOTE — Telephone Encounter (Signed)
Phone room:  Please schedule a nurse visit for 25 ft walk per sarah.  Thanks.  Loring Liskey

## 2022-12-11 NOTE — Telephone Encounter (Signed)
I see encounter. Looks like pt needs a 25 foot timed walk test. Our office is to call and schedule a nurse visit for this.

## 2022-12-12 NOTE — Telephone Encounter (Signed)
FYI to POD 2: Patient is coming tomorrow at 10am for a 25 foot walk test. Timothy Love in phone room spoke with patient and I scheduled him.

## 2022-12-13 ENCOUNTER — Ambulatory Visit (INDEPENDENT_AMBULATORY_CARE_PROVIDER_SITE_OTHER): Payer: Self-pay | Admitting: Neurology

## 2022-12-13 DIAGNOSIS — Z0289 Encounter for other administrative examinations: Secondary | ICD-10-CM

## 2022-12-13 NOTE — Progress Notes (Signed)
Patient arrived for 56ft walk test, used cane for assistance. RN wheeled patient to hallway 1 due to staggered gait. Patient completed walk test 25 ft walk test 20.75 seconds 25 ft walk test 15.78 seconds  Patient declined being pushed out of office in wheelchair.

## 2022-12-20 NOTE — Telephone Encounter (Signed)
Received a request for Add info-submitted the test results clinical notes and faxed to [phone].

## 2023-01-19 ENCOUNTER — Other Ambulatory Visit (HOSPITAL_COMMUNITY): Payer: Self-pay

## 2023-01-19 NOTE — Telephone Encounter (Signed)
Pharmacy Patient Advocate Encounter  Received notification from [employer] that Prior Authorization for Dalfampridine ER 10MG  er tablets has been APPROVED from 01/19/2023 to 01/18/2024  Must fill with [facility] Phone: [phone]  PA #/Case ID/Reference #: PA Case ID #: [id number]

## 2023-01-19 NOTE — Telephone Encounter (Signed)
Pharmacy Patient Advocate Encounter   Received notification from Physician's Office that prior authorization for Dalfampridine ER 10MG  er tablets is required/requested.   Insurance verification completed.   The patient is insured through [employer] .   Per test claim: PA required; PA submitted to [employer] via CoverMyMeds Key/confirmation #/EOC [id number] Status is pending

## 2023-01-22 NOTE — Telephone Encounter (Signed)
Called and informed pt that the medication has been approved for him. Pt verbalized appreciation.

## 2023-02-15 ENCOUNTER — Telehealth: Payer: Self-pay | Admitting: Neurology

## 2023-02-15 NOTE — Telephone Encounter (Signed)
Linnette from [facility] called wanting to inform provider that as of Jan 1st [facility] will no long dispense the medication Kesimpta. From now on all information should be sent to [facility] [phone]

## 2023-02-19 ENCOUNTER — Telehealth: Payer: Self-pay | Admitting: Neurology

## 2023-02-19 ENCOUNTER — Other Ambulatory Visit: Payer: Self-pay

## 2023-02-19 MED ORDER — DALFAMPRIDINE ER 10 MG PO TB12: 10.0000 mg | ORAL_TABLET | Freq: Two times a day (BID) | ORAL | 5 refills | Status: DC

## 2023-02-19 NOTE — Telephone Encounter (Signed)
Marylene Land from [facility] called and LVM stating that they are needing a Rx escribed  to them for the pt's  dalfampridine (AMPYRA) 10 MG TB12 Please advise.

## 2023-02-19 NOTE — Telephone Encounter (Signed)
Noted  

## 2023-02-19 NOTE — Telephone Encounter (Signed)
Also pt LVM as well wanting to know the update on this refill. Please advise.

## 2023-03-28 ENCOUNTER — Other Ambulatory Visit: Payer: Self-pay | Admitting: Neurology

## 2023-03-29 ENCOUNTER — Other Ambulatory Visit: Payer: Self-pay

## 2023-05-08 ENCOUNTER — Encounter: Payer: Self-pay | Admitting: Neurology

## 2023-05-08 ENCOUNTER — Ambulatory Visit (INDEPENDENT_AMBULATORY_CARE_PROVIDER_SITE_OTHER): Payer: PRIVATE HEALTH INSURANCE | Admitting: Neurology

## 2023-05-08 VITALS — BP 135/98 | HR 71 | Ht 72.0 in | Wt 250.0 lb

## 2023-05-08 DIAGNOSIS — R3915 Urgency of urination: Secondary | ICD-10-CM

## 2023-05-08 DIAGNOSIS — Z79899 Other long term (current) drug therapy: Secondary | ICD-10-CM

## 2023-05-08 DIAGNOSIS — R269 Unspecified abnormalities of gait and mobility: Secondary | ICD-10-CM

## 2023-05-08 DIAGNOSIS — G35 Multiple sclerosis: Secondary | ICD-10-CM | POA: Diagnosis not present

## 2023-05-08 DIAGNOSIS — G4733 Obstructive sleep apnea (adult) (pediatric): Secondary | ICD-10-CM

## 2023-05-08 DIAGNOSIS — M21371 Foot drop, right foot: Secondary | ICD-10-CM

## 2023-05-08 MED ORDER — KESIMPTA 20 MG/0.4ML ~~LOC~~ SOAJ
0.4000 mL | SUBCUTANEOUS | 4 refills | Status: DC
Start: 2023-05-08 — End: 2023-05-08

## 2023-05-08 MED ORDER — KESIMPTA 20 MG/0.4ML ~~LOC~~ SOAJ: 0.4000 mL | SUBCUTANEOUS | 4 refills | Status: AC

## 2023-05-08 MED ORDER — DALFAMPRIDINE ER 10 MG PO TB12: 10.0000 mg | ORAL_TABLET | Freq: Two times a day (BID) | ORAL | 11 refills | Status: DC

## 2023-05-08 NOTE — Progress Notes (Signed)
 [facility]  PATIENT: Timothy Love DOB: 10-Jan-1961  REFERRING DOCTOR OR PCP: Modena Callander MD, PhD SOURCE: Patient, notes from Dr. Callander, imaging and lab reports, MRI images personally reviewed.  _________________________________   HISTORICAL  CHIEF COMPLAINT:  Chief Complaint  Patient presents with   Room 10    Pt is here Alone. Pt states that he has been doing better since his last appointment. Pt wants to discuss his Kesimpta .     HISTORY OF PRESENT ILLNESS:  Timothy Love is a 63 year old man with RRMS  UPDATE 05/08/2023 He is on Kesimpta  and tolerates it well.   No significant skin reactions.  No infections.    He denies any exacerbations but gait is mildly worse over time.  He never did the MRI and we will re-order.  He has a poor gait due to right leg weakness and spasticity.    He uses a cane.  He has also noted more difficulty with fine motor and gross motor use of the right hand.   He has right leg spasticity. He wakes up with spasms a lot.    He takes baclofen  10 mg po tid.    He has some dysesthesias, helped by gabapentin .  He switched to Lyrica  early 2020 and feel it is no better.   We discussed Ampyra .   Kidney function is fine.  No h/o seizure.     He is sleepy durig the day.   He has severe OSA AHI=57.9  but has not been able to tolerate APAP when he tried around 2021-2022.     We discussed weight loss (would likely need to lose 40+ pounds).   Also discussed Inspire device.   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   Due to reduced mobility, he was already limited to not use ladders or climbing more than a few feet.  He is right-hand dominant and the right hand has reduced fine motor control and reduced ability to do rapid alternating movements.  There is reduced strength in the right arm and leg.  He gets spasms when he sits for long time of the right leg.  Additionally, he fatigues easily.SABRA  He had Covid-21 December 2018.    He felt very tired a few days but had no severe issues.      MS History He was diagnosed with MS in 2013.  In April 2013 he noticed numbness that started in the feet and ascended to the waist line.   He also had pain in the thighs at the onset. SABRA  He also had mild weakness in the legs.  He had difficulty with his gait and also had urinary and bowel incontinence 20 2013.  He saw orthopedics and had MRI of the thoracic spine showing T2 lesions.  MRI of the brain in 2013 showed multiple T2/flair hyperintense foci including 1 in the right parieto-occipital lobe that had enhancement.  Initially, he was  placed on the drip.  He was on [name] from January 2014 through 02/20/2013 and then switched to [name]  November 2014.  He continued on [name]  until 02/20/2017 when he was switched to Ocrevus.   At the time, his right leg was doing worse though there was no definite exacerbation.  He has tolerated Ocrevus but is wondering about different therapeutic options.  Specifically, he would like to discuss Kesimpta .   Imaging review MRI of the brain 09/07/2020 showed some scattered T2/FLAIR hyperintense foci in the hemispheres and 1 subtle focus in the left medulla.  The overall plaque burden is low.  None of the foci appear to be acute.  Compared to the MRI from 2019, there were no definite new lesions.  MRI of the cervical spine 09/07/2020 showed T2 hyperintense foci to the right at C2-C3, to the right at C3-C4, centrally at C4-C5, to the left at C5 and to the right at C6.  This appears unchanged compared  to the previous MRI from 03/21/2019.  MRI of the cervical spine 10/08/2015 showed foci to the right at C3, centrally at C4, anterolaterally to the left at C4-C5 and to the right at C5-C6.  MRI of the thoracic spine 10/08/2015 showed a focus to the right at T1-T2 a larger focus to the left at T7-T8 and a central focus at T9.  Additionally, there are endplate chronic fractures of T7 and T10.  Laboratory review: CSF November 2013 showed more than 5 oligoclonal bands.  Visual evoked potential showed prolonged P100 latencies on the left consistent with history of left optic neuritis.  Sleep study 04/07/2020 obstructive sleep apnea with an AHI of 57.9   REVIEW OF SYSTEMS: Constitutional: No fevers, chills, sweats, or change in appetite.    He has fatigue and sleepiness.   Eyes: No visual changes, double vision, eye pain Ear, nose and throat: No hearing loss, ear pain, nasal congestion, sore throat Cardiovascular: No chest pain, palpitations Respiratory:  No shortness of breath at rest or with exertion.   No wheezes.  He snores. GastrointestinaI: No nausea, vomiting, diarrhea, abdominal pain, fecal incontinence Genitourinary:  No dysuria, urinary retention or frequency.  No nocturia. Musculoskeletal:  No neck pain, back pain.  Pain in the right leg from spasticity Integumentary: No rash, pruritus, skin lesions Neurological: as above Psychiatric: No depression at this time.  No anxiety Endocrine: No palpitations, diaphoresis, change in appetite, change in weigh or increased thirst Hematologic/Lymphatic:  No anemia, purpura, petechiae. Allergic/Immunologic: No itchy/runny eyes, nasal congestion, recent allergic reactions, rashes  ALLERGIES: Allergies  Allergen Reactions   Penicillins Other (See Comments)    Child-doesn't know  Other Reaction(s): Not available    HOME MEDICATIONS:  Current Outpatient Medications:    alendronate (FOSAMAX) 70 MG tablet, Take 70 mg by mouth once a week. Take  with a full glass of water on an empty stomach., Disp: , Rfl:    baclofen  (LIORESAL ) 10 MG tablet, Take one po qAm, one po qPM and two po qHS, Disp: 360 each, Rfl: 4   brexpiprazole (REXULTI) 1 MG TABS tablet, Take 1 mg by mouth daily., Disp: , Rfl:    Cholecalciferol (VITAMIN D  PO), Take 5,000 Units by mouth daily., Disp: , Rfl:    clonazePAM  (KLONOPIN ) 0.5 MG tablet, Take 1 tablet (0.5 mg total) by mouth at bedtime as needed for anxiety., Disp: 30 tablet, Rfl: 5   cyanocobalamin (,VITAMIN B-12,) 1000 MCG/ML injection, Inject 1,000 mcg into the muscle every 30 (thirty) days., Disp: ,  Rfl:    DULoxetine  (CYMBALTA ) 60 MG capsule, Take 1 capsule (60 mg total) by mouth daily., Disp: 90 capsule, Rfl: 1   FLUoxetine (PROZAC) 40 MG capsule, 40 mg., Disp: , Rfl:    gabapentin  (NEURONTIN ) 600 MG tablet, Take 1 tablet (600 mg total) by mouth 3 (three) times daily., Disp: 270 tablet, Rfl: 1   losartan (COZAAR) 25 MG tablet, Take 25 mg by mouth daily., Disp: , Rfl:    modafinil  (PROVIGIL ) 200 MG tablet, Take 1 tablet (200 mg total) by mouth daily., Disp: 90 tablet, Rfl: 3   pravastatin (PRAVACHOL) 10 MG tablet, Take 10 mg by mouth daily., Disp: , Rfl:    tamsulosin (FLOMAX) 0.4 MG CAPS capsule, Take 0.4 mg by mouth daily., Disp: , Rfl:    tolterodine  (DETROL ) 2 MG tablet, Take 1 tablet (2 mg total) by mouth 2 (two) times daily., Disp: 180 tablet, Rfl: 3   dalfampridine  (AMPYRA ) 10 MG TB12, Take 1 tablet (10 mg total) by mouth in the morning and at bedtime., Disp: 60 tablet, Rfl: 11   KESIMPTA  20 MG/0.4ML SOAJ, Inject 0.4 mLs into the skin every 30 (thirty) days., Disp: 1.2 mL, Rfl: 4  PAST MEDICAL HISTORY: Past Medical History:  Diagnosis Date   Anxiety    Depression    Kidney stone    MS (multiple sclerosis) (HCC)    Unspecified cause of encephalitis, myelitis, and encephalomyelitis     PAST SURGICAL HISTORY: History reviewed. No pertinent surgical history.  FAMILY HISTORY: Family History   Problem Relation Age of Onset   High Cholesterol Mother    Heart Problems Mother    Heart attack Father     SOCIAL HISTORY:  Social History   Socioeconomic History   Marital status: Married    Spouse name: cynthia   Number of children: 2   Years of education: college   Highest education level: Some college, no degree  Occupational History    Employer: [employer]  Tobacco Use   Smoking status: Never   Smokeless tobacco: Never  Vaping Use   Vaping status: Never Used  Substance and Sexual Activity   Alcohol use: No    Alcohol/week: 0.0 standard drinks of alcohol   Drug use: No   Sexual activity: Not Currently  Other Topics Concern   Not on file  Social History Narrative   Not on file   Social Drivers of Health   Financial Resource Strain: Not on file  Food Insecurity: Not on file  Transportation Needs: Not on file  Physical Activity: Not on file  Stress: Not on file  Social Connections: Not on file  Intimate Partner Violence: Not on file     PHYSICAL EXAM  Vitals:   05/08/23 0936  BP: (!) 135/98  Pulse: 71  Weight: 250 lb (113.4 kg)  Height: 6' (1.829 m)    Body mass index is 33.91 kg/m.   General: The patient is well-developed and well-nourished and in no acute distress.  Pharynx is Mallampati 2  HEENT:  Head is [place]/AT.  Sclera are anicteric.     Skin: Extremities are without rash or  edema.  Musculoskeletal:  Back is nontender  Neurologic Exam  Mental status: The patient is alert and oriented x 3 at the time of the examination. The patient has apparent normal recent and remote memory, with an apparently normal attention span and concentration ability.   Speech is normal.  Cranial nerves: Extraocular movements are full. Facial strength and  sensation.  No obvious hearing deficits are noted.  Motor:  Muscle bulk is normal.   Tone is increased on the right, leg more than arm.. Strength is 4+to 5/5 in the right arm and hand.  He has  reduced rapid altering movements in the hand.  Strength is 4/5 in the right leg  Sensory: He has reduced vibration sensation in the right leg.  Normal touch sensation.  Coordination: He has reduced finger-nose-finger in the right arm.  He cannot do heel-to-shin with the right leg.  Gait and station: Station is fairly normal though he needs to use his hands to stand up from the chair.   He has a spastic gait with a reduced stride. He ha a mild right foot drop, worse after taking some steps.  He is unable to do a tandem walk.  Romberg is negative.   Reflexes: Deep tendon reflexes are asymmetrically increased in the legs, with crossed abductor responses at the right knee.  Reflexes were more symmetric in the arm.   Plantar responses are flexor.   25 foot timed walk of 14.1 sec (average of two)    DIAGNOSTIC DATA (LABS, IMAGING, TESTING) - I reviewed patient records, labs, notes, testing and imaging myself where available.  Lab Results  Component Value Date   WBC 7.3 11/21/2022   HGB 14.9 11/21/2022   HCT 44.9 11/21/2022   MCV 90 11/21/2022   PLT 300 11/21/2022      Component Value Date/Time   NA 143 11/21/2022 1226   K 4.7 11/21/2022 1226   CL 103 11/21/2022 1226   CO2 25 11/21/2022 1226   GLUCOSE 88 11/21/2022 1226   BUN 17 11/21/2022 1226   CREATININE 0.76 11/21/2022 1226   CALCIUM 9.8 11/21/2022 1226   PROT 6.7 11/21/2022 1226   ALBUMIN 4.8 11/21/2022 1226   AST 27 11/21/2022 1226   ALT 36 11/21/2022 1226   ALKPHOS 113 11/21/2022 1226   BILITOT 0.5 11/21/2022 1226   GFRNONAA 95 03/17/2019 1407   GFRAA 110 03/17/2019 1407   No results found for: CHOL, HDL, LDLCALC, LDLDIRECT, TRIG, CHOLHDL No results found for: YHAJ8R Lab Results  Component Value Date   VITAMINB12 201 (L) 02/01/2017   Lab Results  Component Value Date   TSH 0.950 11/28/2017       ASSESSMENT AND PLAN  MS (multiple sclerosis) (HCC) - Plan: KESIMPTA  20 MG/0.4ML SOAJ, IgG, IgA,  IgM, CBC with Differential/Platelet, Comprehensive metabolic panel, MR BRAIN W WO CONTRAST, MR CERVICAL SPINE W WO CONTRAST, DISCONTINUED: KESIMPTA  20 MG/0.4ML SOAJ  High risk medication use - Plan: IgG, IgA, IgM, CBC with Differential/Platelet, Comprehensive metabolic panel  Gait abnormality - Plan: MR BRAIN W WO CONTRAST, MR CERVICAL SPINE W WO CONTRAST  Right foot drop  Urinary urgency  OSA (obstructive sleep apnea)   1.   Continue Kesimpta .  Labs were fine I August 2024.  Will recheck IgG/IgM and CBC/D.   Check MRI brain and cervical spine.   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   Ampyra  10 mg po bid ([facility] is much cheaper than through insurance).    Kidney function has been fine.    3.   Continue baclofen  for spasticity.    He also has dysesthesias.  Continue gabapentin  for dysesthesias 3.    Could not tolerate APAP 5-20 for severe OSA.  We discussed weight loss and the Inspire device. 4.    RTC 6 months  Isadore Palecek A. Vear, MD, [facility] 05/08/2023, 10:21 AM Certified in Neurology, Clinical Neurophysiology, Sleep Medicine and Neuroimaging  [facility] [address], [address] [place], [place] [address] [phone]

## 2023-05-08 NOTE — Patient Instructions (Signed)
[email or web address]  Sign up to create an account

## 2023-05-09 ENCOUNTER — Encounter: Payer: Self-pay | Admitting: Neurology

## 2023-05-09 LAB — CBC WITH DIFFERENTIAL/PLATELET
Basophils Absolute: 0.1 10*3/uL (ref 0.0–0.2)
Basos: 1 %
EOS (ABSOLUTE): 0.2 10*3/uL (ref 0.0–0.4)
Eos: 3 %
Hematocrit: 45.2 % (ref 37.5–51.0)
Hemoglobin: 14.6 g/dL (ref 13.0–17.7)
Immature Grans (Abs): 0 10*3/uL (ref 0.0–0.1)
Immature Granulocytes: 0 %
Lymphocytes Absolute: 1.1 10*3/uL (ref 0.7–3.1)
Lymphs: 17 %
MCH: 29.9 pg (ref 26.6–33.0)
MCHC: 32.3 g/dL (ref 31.5–35.7)
MCV: 93 fL (ref 79–97)
Monocytes Absolute: 0.6 10*3/uL (ref 0.1–0.9)
Monocytes: 8 %
Neutrophils Absolute: 4.8 10*3/uL (ref 1.4–7.0)
Neutrophils: 71 %
Platelets: 284 10*3/uL (ref 150–450)
RBC: 4.88 x10E6/uL (ref 4.14–5.80)
RDW: 12.2 % (ref 11.6–15.4)
WBC: 6.8 10*3/uL (ref 3.4–10.8)

## 2023-05-09 LAB — COMPREHENSIVE METABOLIC PANEL
ALT: 31 [IU]/L (ref 0–44)
AST: 26 [IU]/L (ref 0–40)
Albumin: 4.4 g/dL (ref 3.9–4.9)
Alkaline Phosphatase: 109 [IU]/L (ref 44–121)
BUN/Creatinine Ratio: 26 — ABNORMAL HIGH (ref 10–24)
BUN: 21 mg/dL (ref 8–27)
Bilirubin Total: 0.3 mg/dL (ref 0.0–1.2)
CO2: 22 mmol/L (ref 20–29)
Calcium: 9.8 mg/dL (ref 8.6–10.2)
Chloride: 103 mmol/L (ref 96–106)
Creatinine, Ser: 0.81 mg/dL (ref 0.76–1.27)
Globulin, Total: 2.2 g/dL (ref 1.5–4.5)
Glucose: 84 mg/dL (ref 70–99)
Potassium: 4.6 mmol/L (ref 3.5–5.2)
Sodium: 141 mmol/L (ref 134–144)
Total Protein: 6.6 g/dL (ref 6.0–8.5)
eGFR: 100 mL/min/{1.73_m2} (ref >59)

## 2023-05-09 LAB — IGG, IGA, IGM
IgA/Immunoglobulin A, Serum: 84 mg/dL (ref 61–437)
IgG (Immunoglobin G), Serum: 591 mg/dL — ABNORMAL LOW (ref 603–1613)
IgM (Immunoglobulin M), Srm: 33 mg/dL (ref 20–172)

## 2023-05-16 ENCOUNTER — Telehealth: Payer: Self-pay | Admitting: Neurology

## 2023-05-16 NOTE — Telephone Encounter (Signed)
[employer] and spoke with nurse Lurena Joiner. [facility]: [phone] exp. 05/16/23-06/13/23 sent to [facility]. [phone]

## 2023-05-30 ENCOUNTER — Ambulatory Visit: Payer: PRIVATE HEALTH INSURANCE | Admitting: Neurology

## 2023-06-13 ENCOUNTER — Telehealth: Payer: Self-pay | Admitting: Pharmacy Technician

## 2023-06-13 NOTE — Telephone Encounter (Signed)
 Pharmacy Patient Advocate Encounter   Received notification from CoverMyMeds that prior authorization for Kesimpta 20MG /0.4ML auto-injectors is required/requested.   Insurance verification completed.   The patient is insured through [employer] .   Per test claim: PA required; PA submitted to above mentioned insurance via CoverMyMeds Key/confirmation #/EOC [id number] Status is pending

## 2023-06-18 ENCOUNTER — Telehealth: Payer: Self-pay | Admitting: *Deleted

## 2023-08-09 ENCOUNTER — Other Ambulatory Visit: Payer: Self-pay | Admitting: Neurology

## 2023-08-09 MED ORDER — GABAPENTIN 600 MG PO TABS: 600.0000 mg | ORAL_TABLET | Freq: Three times a day (TID) | ORAL | 1 refills | Status: DC

## 2023-08-09 NOTE — Telephone Encounter (Signed)
 Requested Prescriptions   Signed Prescriptions Disp Refills   gabapentin  (NEURONTIN ) 600 MG tablet 270 tablet 0    Sig: TAKE 1 TABLET BY MOUTH THREE TIMES DAILY    Authorizing Provider: Wess Hammed    Ordering User: Mollie Anger E   APPARENTLY ITS CONTROLLED WHERE ITS FILLED   LAST SEEN 05/08/23, NEXT APPT 11/07/23   Dispenses   Dispensed Days Supply Quantity Provider Pharmacy  GABAPENTIN  600MG     TAB 03/18/2023 90 270 each Wess Hammed, NP [facility] [phone] ...  GABAPENTIN  600MG     TAB 11/21/2022 90 270 each Wess Hammed, NP [facility] [phone] .Aaron AasAaron Aas

## 2023-11-07 ENCOUNTER — Encounter: Payer: Self-pay | Admitting: Neurology

## 2023-11-07 ENCOUNTER — Ambulatory Visit (INDEPENDENT_AMBULATORY_CARE_PROVIDER_SITE_OTHER): Payer: PRIVATE HEALTH INSURANCE | Admitting: Neurology

## 2023-11-07 VITALS — BP 132/86 | HR 56 | Ht 72.0 in | Wt 221.0 lb

## 2023-11-07 DIAGNOSIS — G35 Multiple sclerosis: Secondary | ICD-10-CM | POA: Diagnosis not present

## 2023-11-07 MED ORDER — DULOXETINE HCL 60 MG PO CPEP: 60.0000 mg | ORAL_CAPSULE | Freq: Every day | ORAL | 3 refills | Status: AC

## 2023-11-07 MED ORDER — TOLTERODINE TARTRATE 2 MG PO TABS: 2.0000 mg | ORAL_TABLET | Freq: Two times a day (BID) | ORAL | 3 refills | Status: AC

## 2023-11-07 MED ORDER — BACLOFEN 10 MG PO TABS: 10.0000 mg | ORAL_TABLET | Freq: Three times a day (TID) | ORAL | 3 refills | Status: AC

## 2023-11-07 NOTE — Patient Instructions (Signed)
 Restart baclofen  10 mg 3 times daily for spasticity  Continue other medications Check labs  Physical therapy

## 2023-11-07 NOTE — Progress Notes (Addendum)
 [facility]  PATIENT: Cy Bresee DOB: 02/24/1961  REFERRING DOCTOR OR PCP: Modena Callander MD, PhD SOURCE: Patient, notes from Dr. Callander, imaging and lab reports, MRI images personally reviewed. 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 Has lost 25 lbs since last seen!! He stopped eating junk.  Remains on Kesimpta .  Stopped Amypra, he didn't think it was helping. Stopped baclofen , he wasn't having any muscle spasms. On gabapentin  600 mg TID for aches and pains, especially in lower back. On Cymbalta  60 mg daily for aches and pains. Driving, he retired as Personnel officer, on disability now. Uses a cane. Right side is chronically weaker from MS, having left knee pain. He has fallen a few times in the last 6 weeks, due to left knee giving out. Has seen orthopedics. Injection, eventually needs knee replaced. No longer has issues with urinary leaking on Detrol . Overtime right arm and hand are getting weaker. Since weight loss, wife hasn't mentioned hearing as much snoring, they do sleep in separate rooms.   [facility] Report 06/05/23 MRI of the brain with and without contrast 1.  No acute findings or enhancing masses 2.  Relatively stable scattered mild white matter T2 hyperintensities  MRI cervical spine with and without contrast 1.  Stable scattered white matter T2 hyperintensities without enhancing lesions. 2.  Multilevel degenerative changes with stable moderate bilateral neuroforaminal narrowing at C5-C6 and C6-C7.  UPDATE 05/08/2023 05/08/23 Dr. Vear:  He is on Kesimpta  and tolerates it well.   No significant skin reactions.  No infections.    He denies  any exacerbations but gait is mildly worse over time.  He never did the MRI and we will re-order.  He has a poor gait due to right leg weakness and spasticity.    He uses a cane.  He has also noted more difficulty with fine motor and gross motor use of the right hand.   He has right leg spasticity. He wakes up with spasms a lot.    He takes baclofen  10 mg po tid.    He has some dysesthesias, helped by gabapentin .  He switched to Lyrica  early 2020 and feel it is no better.   We discussed Ampyra .   Kidney function is fine.  No h/o seizure.     He is sleepy durig the day.   He has severe OSA AHI=57.9  but has not been able to tolerate APAP when he tried around 2021-2022.     We discussed weight loss (would likely need to lose 40+ pounds).   Also discussed Inspire device.   EPWORTH SLEEPINESS SCALE  On a scale of 0 - 3 what is the chance of dozing:  Sitting and Reading:   2 Watching TV:    3 Sitting inactive in a public place: 3 Passenger in car for one hour: 3  Lying down to rest in the afternoon: 3 Sitting and talking to someone: 1 Sitting quietly after lunch:  3 In a car, stopped in traffic:  0  Total (out of 24):   18/24    Moderate excessive dayitme sleepiness  He works  as an Personnel officer but is having more difficulty doing his job.  Due to reduced mobility, he was already limited to not use ladders or climbing more than a few feet.  He is right-hand dominant and the right hand has reduced fine motor control and reduced ability to do rapid alternating movements.  There is reduced strength in the right arm and leg.  He gets spasms when he sits for long time of the right leg.  Additionally, he fatigues easily.SABRA  He had Covid-21 December 2018.    He felt very tired a few days but had no severe issues.      MS History He was diagnosed with MS in 2013.  In April 2013 he noticed numbness that started in the feet and ascended to the waist line.   He also had pain in the thighs at the onset. SABRA   He also had mild weakness in the legs.  He had difficulty with his gait and also had urinary and bowel incontinence 20 2013.  He saw orthopedics and had MRI of the thoracic spine showing T2 lesions.  MRI of the brain in 2013 showed multiple T2/flair hyperintense foci including 1 in the right parieto-occipital lobe that had enhancement.  Initially, he was placed on the drip.  He was on [name] from January 2014 through 02/20/2013 and then switched to [name]  November 2014.  He continued on [name]  until 02/20/2017 when he was switched to Ocrevus.   At the time, his right leg was doing worse though there was no definite exacerbation.  He has tolerated Ocrevus but is wondering about different therapeutic options.  Specifically, he would like to discuss Kesimpta .   Imaging review MRI of the brain 09/07/2020 showed some scattered T2/FLAIR hyperintense foci in the hemispheres and 1 subtle focus in the left medulla.  The overall plaque burden is low.  None of the foci appear to be acute.  Compared to the MRI from 2019, there were no definite new lesions.  MRI of the cervical spine 09/07/2020 showed T2 hyperintense foci to the right at C2-C3, to the right at C3-C4, centrally at C4-C5, to the left at C5 and to the right at C6.  This appears unchanged compared to the previous MRI from 03/21/2019.  MRI of the cervical spine 10/08/2015 showed foci to the right at C3, centrally at C4, anterolaterally to the left at C4-C5 and to the right at C5-C6.  MRI of the thoracic spine 10/08/2015 showed a focus to the right at T1-T2 a larger focus to the left at T7-T8 and a central focus at T9.  Additionally, there are endplate chronic fractures of T7 and T10.  Laboratory review: CSF November 2013 showed more than 5 oligoclonal bands.  Visual evoked potential showed prolonged P100 latencies on the left consistent with history of left optic neuritis.  Sleep study 04/07/2020 obstructive sleep apnea with an AHI of 57.9   REVIEW OF  SYSTEMS: Constitutional: No fevers, chills, sweats, or change in appetite.    He has fatigue and sleepiness.   Eyes: No visual changes, double vision, eye pain Ear, nose and throat: No hearing loss, ear pain, nasal congestion, sore throat Cardiovascular: No chest pain, palpitations Respiratory:  No shortness of breath at rest or with exertion.   No wheezes.  He snores. GastrointestinaI: No nausea, vomiting, diarrhea, abdominal pain, fecal incontinence Genitourinary:  No dysuria, urinary retention or frequency.  No nocturia. Musculoskeletal:  No neck pain, back pain.  Pain in the right leg  from spasticity Integumentary: No rash, pruritus, skin lesions Neurological: as above Psychiatric: No depression at this time.  No anxiety Endocrine: No palpitations, diaphoresis, change in appetite, change in weigh or increased thirst Hematologic/Lymphatic:  No anemia, purpura, petechiae. Allergic/Immunologic: No itchy/runny eyes, nasal congestion, recent allergic reactions, rashes  ALLERGIES: Allergies  Allergen Reactions   Penicillins Other (See Comments)    Child-doesn't know  Other Reaction(s): Not available    HOME MEDICATIONS:  Current Outpatient Medications:    alendronate (FOSAMAX) 70 MG tablet, Take 70 mg by mouth once a week. Take with a full glass of water on an empty stomach., Disp: , Rfl:    brexpiprazole (REXULTI) 1 MG TABS tablet, Take 1 mg by mouth daily., Disp: , Rfl:    Cholecalciferol (VITAMIN D  PO), Take 5,000 Units by mouth daily., Disp: , Rfl:    clonazePAM  (KLONOPIN ) 0.5 MG tablet, Take 1 tablet (0.5 mg total) by mouth at bedtime as needed for anxiety., Disp: 30 tablet, Rfl: 5   cyanocobalamin (,VITAMIN B-12,) 1000 MCG/ML injection, Inject 1,000 mcg into the muscle every 30 (thirty) days., Disp: , Rfl:    FLUoxetine (PROZAC) 40 MG capsule, 40 mg., Disp: , Rfl:    gabapentin  (NEURONTIN ) 600 MG tablet, Take 1 tablet (600 mg total) by mouth 3 (three) times daily., Disp: 270  tablet, Rfl: 1   KESIMPTA  20 MG/0.4ML SOAJ, Inject 0.4 mLs into the skin every 30 (thirty) days., Disp: 1.2 mL, Rfl: 4   losartan (COZAAR) 25 MG tablet, Take 25 mg by mouth daily., Disp: , Rfl:    modafinil  (PROVIGIL ) 200 MG tablet, Take 1 tablet (200 mg total) by mouth daily., Disp: 90 tablet, Rfl: 3   pravastatin (PRAVACHOL) 10 MG tablet, Take 10 mg by mouth daily., Disp: , Rfl:    tamsulosin (FLOMAX) 0.4 MG CAPS capsule, Take 0.4 mg by mouth daily., Disp: , Rfl:    baclofen  (LIORESAL ) 10 MG tablet, Take 1 tablet (10 mg total) by mouth 3 (three) times daily., Disp: 270 each, Rfl: 3   DULoxetine  (CYMBALTA ) 60 MG capsule, Take 1 capsule (60 mg total) by mouth daily., Disp: 90 capsule, Rfl: 3   tolterodine  (DETROL ) 2 MG tablet, Take 1 tablet (2 mg total) by mouth 2 (two) times daily., Disp: 180 tablet, Rfl: 3  PAST MEDICAL HISTORY: Past Medical History:  Diagnosis Date   Anxiety    Depression    Kidney stone    MS (multiple sclerosis) (HCC)    Unspecified cause of encephalitis, myelitis, and encephalomyelitis     PAST SURGICAL HISTORY: History reviewed. No pertinent surgical history.  FAMILY HISTORY: Family History  Problem Relation Age of Onset   High Cholesterol Mother    Heart Problems Mother    Heart attack Father     SOCIAL HISTORY:  Social History   Socioeconomic History   Marital status: Married    Spouse name: cynthia   Number of children: 2   Years of education: college   Highest education level: Some college, no degree  Occupational History    Employer: [employer]  Tobacco Use   Smoking status: Never   Smokeless tobacco: Never  Vaping Use   Vaping status: Never Used  Substance and Sexual Activity   Alcohol use: No    Alcohol/week: 0.0 standard drinks of alcohol   Drug use: No   Sexual activity: Not Currently  Other Topics Concern   Not on file  Social History Narrative   Not on file  Social Drivers of Corporate investment banker  Strain: Not on file  Food Insecurity: Not on file  Transportation Needs: Not on file  Physical Activity: Not on file  Stress: Not on file  Social Connections: Not on file  Intimate Partner Violence: Not on file     PHYSICAL EXAM  Vitals:   11/07/23 1022  BP: 132/86  Pulse: (!) 56  SpO2: 96%  Weight: 221 lb (100.2 kg)  Height: 6' (1.829 m)   Physical Exam  General: The patient is alert and cooperative at the time of the examination.  Skin: No significant peripheral edema is noted.  Neurologic Exam  Mental status: The patient is alert and oriented x 3 at the time of the examination. The patient has apparent normal recent and remote memory, with an apparently normal attention span and concentration ability.  Cranial nerves: Facial symmetry is present. Speech is normal, no aphasia or dysarthria is noted. Extraocular movements are full. Visual fields are full.  Motor: Increased tone in the right leg, more than the arm. 4/5 right arm strength, hand grip, 3/5 right leg, right foot drop   Sensory examination: Soft touch sensation is symmetric on the face, arms, and legs.  Coordination: Diminished ability to do right finger-nose-finger, cannot do heel-to-shin with the right leg  Gait and station: Has to push off from seated position to stand, spastic type gait, slow, stiff, right foot drop. In a wheelchair today, I took him out to the car, can walk short distance with cane but is stiff  Reflexes: Deep tendon reflexes are symmetric but increased at the knees    DIAGNOSTIC DATA (LABS, IMAGING, TESTING) - I reviewed patient records, labs, notes, testing and imaging myself where available.  Lab Results  Component Value Date   WBC 6.8 05/08/2023   HGB 14.6 05/08/2023   HCT 45.2 05/08/2023   MCV 93 05/08/2023   PLT 284 05/08/2023      Component Value Date/Time   NA 141 05/08/2023 1029   K 4.6 05/08/2023 1029   CL 103 05/08/2023 1029   CO2 22 05/08/2023 1029   GLUCOSE 84  05/08/2023 1029   BUN 21 05/08/2023 1029   CREATININE 0.81 05/08/2023 1029   CALCIUM 9.8 05/08/2023 1029   PROT 6.6 05/08/2023 1029   ALBUMIN 4.4 05/08/2023 1029   AST 26 05/08/2023 1029   ALT 31 05/08/2023 1029   ALKPHOS 109 05/08/2023 1029   BILITOT 0.3 05/08/2023 1029   GFRNONAA 95 03/17/2019 1407   GFRAA 110 03/17/2019 1407   No results found for: CHOL, HDL, LDLCALC, LDLDIRECT, TRIG, CHOLHDL No results found for: YHAJ8R Lab Results  Component Value Date   VITAMINB12 201 (L) 02/01/2017   Lab Results  Component Value Date   TSH 0.950 11/28/2017     ASSESSMENT AND PLAN  1.  Multiple sclerosis 2.  Gait abnormality, right foot drop 3.  Urinary urgency 4.  OSA  - Restart baclofen , more spasticity to the right side, more falls.  Will remain off Ampyra , unclear benefit - Referral to physical therapy - Continue Kesimpta , Dr. 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 - Follow up in 6 months with Dr. Vear to rotate visits with me  Addendum 01/03/24 SS: Patient went to [facility] for AFO. Needed for gait instability and foot drop. High risk for falls without AFO. Please work with patient to have fitted for AFO.  Lauraine Gayland MANDES, DNP  [facility] [address], [address] [place], [place] [address] [phone]

## 2023-11-08 ENCOUNTER — Ambulatory Visit: Payer: Self-pay | Admitting: Neurology

## 2023-11-08 ENCOUNTER — Telehealth: Payer: Self-pay | Admitting: Neurology

## 2023-11-08 LAB — COMPREHENSIVE METABOLIC PANEL WITH GFR
ALT: 23 IU/L (ref 0–44)
AST: 23 IU/L (ref 0–40)
Albumin: 4.5 g/dL (ref 3.9–4.9)
Alkaline Phosphatase: 99 IU/L (ref 44–121)
BUN/Creatinine Ratio: 28 — ABNORMAL HIGH (ref 10–24)
BUN: 24 mg/dL (ref 8–27)
Bilirubin Total: 0.5 mg/dL (ref 0.0–1.2)
CO2: 24 mmol/L (ref 20–29)
Calcium: 10 mg/dL (ref 8.6–10.2)
Chloride: 105 mmol/L (ref 96–106)
Creatinine, Ser: 0.86 mg/dL (ref 0.76–1.27)
Globulin, Total: 1.8 g/dL (ref 1.5–4.5)
Glucose: 91 mg/dL (ref 70–99)
Potassium: 4.5 mmol/L (ref 3.5–5.2)
Sodium: 144 mmol/L (ref 134–144)
Total Protein: 6.3 g/dL (ref 6.0–8.5)
eGFR: 97 mL/min/1.73 (ref >59)

## 2023-11-08 LAB — CBC WITH DIFFERENTIAL/PLATELET
Basophils Absolute: 0.1 x10E3/uL (ref 0.0–0.2)
Basos: 1 %
EOS (ABSOLUTE): 0.2 x10E3/uL (ref 0.0–0.4)
Eos: 3 %
Hematocrit: 44.8 % (ref 37.5–51.0)
Hemoglobin: 14.8 g/dL (ref 13.0–17.7)
Immature Grans (Abs): 0 x10E3/uL (ref 0.0–0.1)
Immature Granulocytes: 0 %
Lymphocytes Absolute: 1 x10E3/uL (ref 0.7–3.1)
Lymphs: 15 %
MCH: 30.8 pg (ref 26.6–33.0)
MCHC: 33 g/dL (ref 31.5–35.7)
MCV: 93 fL (ref 79–97)
Monocytes Absolute: 0.4 x10E3/uL (ref 0.1–0.9)
Monocytes: 7 %
Neutrophils Absolute: 4.7 x10E3/uL (ref 1.4–7.0)
Neutrophils: 73 %
Platelets: 224 x10E3/uL (ref 150–450)
RBC: 4.8 x10E6/uL (ref 4.14–5.80)
RDW: 12.4 % (ref 11.6–15.4)
WBC: 6.4 x10E3/uL (ref 3.4–10.8)

## 2023-11-08 LAB — IGG, IGA, IGM
IgA/Immunoglobulin A, Serum: 80 mg/dL (ref 61–437)
IgG (Immunoglobin G), Serum: 574 mg/dL — ABNORMAL LOW (ref 603–1613)
IgM (Immunoglobulin M), Srm: 40 mg/dL (ref 20–172)

## 2023-11-08 NOTE — Telephone Encounter (Signed)
 Referral for Physical Therapy  faxed to  [facility]  [facility] () Phone:[phone] FAX: [phone]

## 2023-11-14 ENCOUNTER — Ambulatory Visit: Payer: PRIVATE HEALTH INSURANCE | Admitting: Neurology

## 2023-11-20 ENCOUNTER — Telehealth: Payer: Self-pay | Admitting: *Deleted

## 2023-11-20 NOTE — Telephone Encounter (Signed)
 Cd and report mailed to pt on 11/20/2023

## 2024-03-30 ENCOUNTER — Other Ambulatory Visit: Payer: Self-pay | Admitting: Neurology

## 2024-04-02 MED ORDER — GABAPENTIN 600 MG PO TABS: 600.0000 mg | ORAL_TABLET | Freq: Three times a day (TID) | ORAL | 1 refills | Status: AC

## 2024-04-02 NOTE — Telephone Encounter (Addendum)
 Last seen on 11/07/23 Follow up scheduled on 06/12/24  Dispensed Days Supply Quantity Provider Pharmacy  GABAPENTIN  600MG     TAB 11/25/2023 90 270 each Gayland Lauraine PARAS, NP [facility] [phone] ...      Sarah I accidentally approved Rx, gabapentin  is controlled in [place]   Rx pending

## 2024-06-12 ENCOUNTER — Ambulatory Visit: Payer: PRIVATE HEALTH INSURANCE | Admitting: Neurology
